# Patient Record
Sex: Male | Born: 1985 | Race: White | Hispanic: No | Marital: Single | Smoking: Current some day smoker
Health system: Southern US, Community
[De-identification: ages and names within clinical notes are randomized; demographics above are authoritative.]

## PROBLEM LIST (undated history)

## (undated) DIAGNOSIS — B1920 Unspecified viral hepatitis C without hepatic coma: Secondary | ICD-10-CM

---
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
## 2004-02-12 ENCOUNTER — Emergency Department: Payer: Self-pay | Admitting: Emergency Medicine

## 2007-03-26 ENCOUNTER — Emergency Department: Payer: Self-pay | Admitting: Emergency Medicine

## 2007-10-30 ENCOUNTER — Emergency Department: Payer: Self-pay | Admitting: Emergency Medicine

## 2007-12-04 ENCOUNTER — Emergency Department: Payer: Self-pay | Admitting: Emergency Medicine

## 2009-09-22 ENCOUNTER — Ambulatory Visit: Payer: Self-pay | Admitting: Podiatry

## 2013-03-31 ENCOUNTER — Emergency Department: Payer: Self-pay | Admitting: Emergency Medicine

## 2013-03-31 LAB — CBC
HCT: 53.5 % — AB (ref 40.0–52.0)
HGB: 18.4 g/dL — ABNORMAL HIGH (ref 13.0–18.0)
MCH: 32.9 pg (ref 26.0–34.0)
MCHC: 34.4 g/dL (ref 32.0–36.0)
MCV: 96 fL (ref 80–100)
Platelet: 161 10*3/uL (ref 150–440)
RBC: 5.59 10*6/uL (ref 4.40–5.90)
RDW: 14.7 % — ABNORMAL HIGH (ref 11.5–14.5)
WBC: 7.4 10*3/uL (ref 3.8–10.6)

## 2013-03-31 LAB — TROPONIN I: Troponin-I: <0.02 ng/mL

## 2013-03-31 LAB — BASIC METABOLIC PANEL
Anion Gap: 7 (ref 7–16)
BUN: 4 mg/dL — AB (ref 7–18)
CALCIUM: 9.2 mg/dL (ref 8.5–10.1)
CO2: 28 mmol/L (ref 21–32)
Chloride: 100 mmol/L (ref 98–107)
Creatinine: 0.75 mg/dL (ref 0.60–1.30)
EGFR (Non-African Amer.): >60
GLUCOSE: 133 mg/dL — AB (ref 65–99)
Osmolality: 269 (ref 275–301)
Potassium: 4.4 mmol/L (ref 3.5–5.1)
Sodium: 135 mmol/L — ABNORMAL LOW (ref 136–145)

## 2013-06-01 ENCOUNTER — Emergency Department: Payer: Self-pay | Admitting: Emergency Medicine

## 2013-06-01 LAB — BASIC METABOLIC PANEL
Anion Gap: 8 (ref 7–16)
BUN: 5 mg/dL — AB (ref 7–18)
CALCIUM: 8.6 mg/dL (ref 8.5–10.1)
CHLORIDE: 99 mmol/L (ref 98–107)
CO2: 28 mmol/L (ref 21–32)
Creatinine: 0.67 mg/dL (ref 0.60–1.30)
EGFR (Non-African Amer.): >60
GLUCOSE: 90 mg/dL (ref 65–99)
Osmolality: 267 (ref 275–301)
POTASSIUM: 4 mmol/L (ref 3.5–5.1)
Sodium: 135 mmol/L — ABNORMAL LOW (ref 136–145)

## 2013-06-01 LAB — CBC
HCT: 43.7 % (ref 40.0–52.0)
HGB: 15.1 g/dL (ref 13.0–18.0)
MCH: 32 pg (ref 26.0–34.0)
MCHC: 34.6 g/dL (ref 32.0–36.0)
MCV: 93 fL (ref 80–100)
Platelet: 155 10*3/uL (ref 150–440)
RBC: 4.72 10*6/uL (ref 4.40–5.90)
RDW: 14 % (ref 11.5–14.5)
WBC: 3.8 10*3/uL (ref 3.8–10.6)

## 2013-06-01 LAB — TROPONIN I: Troponin-I: <0.02 ng/mL

## 2013-06-03 ENCOUNTER — Emergency Department: Payer: Self-pay | Admitting: Emergency Medicine

## 2013-06-03 LAB — CBC
HCT: 41.3 % (ref 40.0–52.0)
HGB: 14.2 g/dL (ref 13.0–18.0)
MCH: 32.1 pg (ref 26.0–34.0)
MCHC: 34.3 g/dL (ref 32.0–36.0)
MCV: 94 fL (ref 80–100)
Platelet: 125 10*3/uL — ABNORMAL LOW (ref 150–440)
RBC: 4.42 10*6/uL (ref 4.40–5.90)
RDW: 14.2 % (ref 11.5–14.5)
WBC: 3.4 10*3/uL — ABNORMAL LOW (ref 3.8–10.6)

## 2013-06-03 LAB — BASIC METABOLIC PANEL
Anion Gap: 6 — ABNORMAL LOW (ref 7–16)
BUN: 4 mg/dL — ABNORMAL LOW (ref 7–18)
CREATININE: 0.63 mg/dL (ref 0.60–1.30)
Calcium, Total: 8.7 mg/dL (ref 8.5–10.1)
Chloride: 100 mmol/L (ref 98–107)
Co2: 30 mmol/L (ref 21–32)
GLUCOSE: 74 mg/dL (ref 65–99)
Osmolality: 267 (ref 275–301)
Potassium: 3.6 mmol/L (ref 3.5–5.1)
SODIUM: 136 mmol/L (ref 136–145)

## 2013-06-03 LAB — TROPONIN I: Troponin-I: <0.02 ng/mL

## 2021-07-19 ENCOUNTER — Encounter: Payer: Self-pay | Admitting: Emergency Medicine

## 2021-07-19 ENCOUNTER — Emergency Department: Payer: Self-pay

## 2021-07-19 ENCOUNTER — Inpatient Hospital Stay
Admission: EM | Admit: 2021-07-19 | Discharge: 2021-07-20 | DRG: 603 | Discharge status: Against Medical Advice | Payer: Self-pay | Attending: Internal Medicine | Admitting: Internal Medicine

## 2021-07-19 DIAGNOSIS — E871 Hypo-osmolality and hyponatremia: Secondary | ICD-10-CM | POA: Diagnosis present

## 2021-07-19 DIAGNOSIS — L03116 Cellulitis of left lower limb: Secondary | ICD-10-CM | POA: Diagnosis present

## 2021-07-19 DIAGNOSIS — B182 Chronic viral hepatitis C: Secondary | ICD-10-CM

## 2021-07-19 DIAGNOSIS — Z720 Tobacco use: Secondary | ICD-10-CM

## 2021-07-19 DIAGNOSIS — L03115 Cellulitis of right lower limb: Principal | ICD-10-CM | POA: Diagnosis present

## 2021-07-19 DIAGNOSIS — I4510 Unspecified right bundle-branch block: Secondary | ICD-10-CM | POA: Diagnosis present

## 2021-07-19 DIAGNOSIS — F17210 Nicotine dependence, cigarettes, uncomplicated: Secondary | ICD-10-CM | POA: Diagnosis present

## 2021-07-19 DIAGNOSIS — E878 Other disorders of electrolyte and fluid balance, not elsewhere classified: Secondary | ICD-10-CM | POA: Diagnosis present

## 2021-07-19 DIAGNOSIS — R000 Tachycardia, unspecified: Secondary | ICD-10-CM

## 2021-07-19 DIAGNOSIS — F1010 Alcohol abuse, uncomplicated: Secondary | ICD-10-CM

## 2021-07-19 DIAGNOSIS — B1920 Unspecified viral hepatitis C without hepatic coma: Secondary | ICD-10-CM

## 2021-07-19 HISTORY — DX: Unspecified viral hepatitis C without hepatic coma: B19.20

## 2021-07-19 LAB — TROPONIN I (HIGH SENSITIVITY): Troponin I (High Sensitivity): 4 ng/L (ref <18)

## 2021-07-19 LAB — BASIC METABOLIC PANEL
Anion gap: 12 (ref 5–15)
BUN: 13 mg/dL (ref 6–20)
CO2: 22 mmol/L (ref 22–32)
Calcium: 8.9 mg/dL (ref 8.9–10.3)
Chloride: 96 mmol/L — ABNORMAL LOW (ref 98–111)
Creatinine, Ser: 0.84 mg/dL (ref 0.61–1.24)
GFR, Estimated: >60 mL/min (ref >60)
Glucose, Bld: 95 mg/dL (ref 70–99)
Potassium: 3.7 mmol/L (ref 3.5–5.1)
Sodium: 130 mmol/L — ABNORMAL LOW (ref 135–145)

## 2021-07-19 LAB — CBC
HCT: 38.8 % — ABNORMAL LOW (ref 39.0–52.0)
Hemoglobin: 13.2 g/dL (ref 13.0–17.0)
MCH: 29.6 pg (ref 26.0–34.0)
MCHC: 34 g/dL (ref 30.0–36.0)
MCV: 87 fL (ref 80.0–100.0)
Platelets: 259 10*3/uL (ref 150–400)
RBC: 4.46 MIL/uL (ref 4.22–5.81)
RDW: 12.5 % (ref 11.5–15.5)
WBC: 10.4 10*3/uL (ref 4.0–10.5)
nRBC: 0 % (ref 0.0–0.2)

## 2021-07-19 LAB — BRAIN NATRIURETIC PEPTIDE: B Natriuretic Peptide: 21.9 pg/mL (ref 0.0–100.0)

## 2021-07-19 LAB — LACTIC ACID, PLASMA: Lactic Acid, Venous: 1.2 mmol/L (ref 0.5–1.9)

## 2021-07-19 MED ORDER — MORPHINE SULFATE (PF) 4 MG/ML IV SOLN
4.0000 mg | Freq: Once | INTRAVENOUS | Status: AC
  Administered 2021-07-19: 4 mg via INTRAVENOUS
  Filled 2021-07-19: qty 1

## 2021-07-19 MED ORDER — VANCOMYCIN HCL 1500 MG/300ML IV SOLN
1500.0000 mg | Freq: Once | INTRAVENOUS | Status: AC
Start: 2021-07-19 — End: 2021-07-20
  Administered 2021-07-20: 1500 mg via INTRAVENOUS
  Filled 2021-07-19: qty 300

## 2021-07-19 MED ORDER — LACTATED RINGERS IV BOLUS
2000.0000 mL | Freq: Once | INTRAVENOUS | Status: AC
Start: 2021-07-19 — End: 2021-07-20
  Administered 2021-07-19: 2000 mL via INTRAVENOUS

## 2021-07-19 MED ORDER — SODIUM CHLORIDE 0.9 % IV SOLN
2.0000 g | Freq: Once | INTRAVENOUS | Status: AC
  Administered 2021-07-19: 2 g via INTRAVENOUS
  Filled 2021-07-19: qty 20

## 2021-07-19 MED ORDER — ONDANSETRON HCL 4 MG/2ML IJ SOLN
4.0000 mg | Freq: Once | INTRAMUSCULAR | Status: AC
  Administered 2021-07-19: 4 mg via INTRAVENOUS
  Filled 2021-07-19: qty 2

## 2021-07-19 NOTE — Progress Notes (Signed)
CODE SEPSIS - PHARMACY COMMUNICATION ? ?**Broad Spectrum Antibiotics should be administered within 1 hour of Sepsis diagnosis** ? ?Time Code Sepsis Called/Page Received: 2250 ? ?Antibiotics Ordered: Ceftriaxone & Vancomycin ? ?Time of 1st antibiotic administration: 2250 ? ?Otelia Sergeant, PharmD, MBA ?07/19/2021 ?11:01 PM ? ?

## 2021-07-19 NOTE — Consult Note (Signed)
PHARMACY -  BRIEF ANTIBIOTIC NOTE  ? ?Pharmacy has received consult(s) for Vancomycin from an ED provider.  The patient's profile has been reviewed for ht/wt/allergies/indication/available labs.   ? ?One time order(s) placed for Vancomycin 1500mg  IV x 1 dose. ? ?Further antibiotics/pharmacy consults should be ordered by admitting physician if indicated.       ?                ?Thank you, ?Samarrah Tranchina A Hercules Hasler ?07/19/2021  10:18 PM ? ?

## 2021-07-19 NOTE — ED Provider Notes (Signed)
? ?[facility] ?Provider Note ? ? ? Event Date/Time  ? First MD Initiated Contact with Patient 07/19/21 2153   ?  (approximate) ? ? ?History  ? ?Leg Swelling ? ? ?HPI ? ?Douglas Collins is a 36 y.o. male here with bilateral foot pain and swelling.  The patient states that over the last 2 days or so, he has noticed progressively worsening redness, pain, swelling, in his bilateral feet.  He states that he recently got out of prison about 2 weeks ago, and states he has been on his feet essentially nonstop since then.  He denies direct trauma.  He states that his pain has gotten significant in the right greater than left ankles, as well as redness and fever.  He said chills.  He has had some mild nausea.  He has no known history of recurrent cellulitis.  Denies any direct injuries.  No numbness or weakness.  No history of DVT or PE.  Denies drug use. ?  ? ? ?Physical Exam  ? ?Triage Vital Signs: ?ED Triage Vitals  ?Enc Vitals Group  ?   BP 07/19/21 2104 111/63  ?   Pulse Rate 07/19/21 2104 (!) 110  ?   Resp 07/19/21 2104 18  ?   Temp 07/19/21 2104 100 ?F (37.8 ?C)  ?   Temp Source 07/19/21 2104 Oral  ?   SpO2 07/19/21 2104 96 %  ?   Weight 07/19/21 2105 161 lb (73 kg)  ?   Height 07/19/21 2105 5\' 8"  (1.727 m)  ?   Head Circumference --   ?   Peak Flow --   ?   Pain Score 07/19/21 2105 4  ?   Pain Loc --   ?   Pain Edu? --   ?   Excl. in GC? --   ? ? ?Most recent vital signs: ?Vitals:  ? 07/19/21 2104  ?BP: 111/63  ?Pulse: (!) 110  ?Resp: 18  ?Temp: 100 ?F (37.8 ?C)  ?SpO2: 96%  ? ? ? ?General: Awake, no distress.  ?CV:  Good peripheral perfusion.  Tachycardic. ?Resp:  Normal effort.  Lungs clear. ?Abd:  No distention.  No tenderness. ?Other:  Marked erythema, induration, and edema of the bilateral ankles, with erythema on the right leg streaking up to the knee medially.  There is erythema streaking on the left to about the mid calf.  Diffuse tenderness to palpation.  No overt  fluctuance. ? ? ?ED Results / Procedures / Treatments  ? ?Labs ?(all labs ordered are listed, but only abnormal results are displayed) ?Labs Reviewed  ?BASIC METABOLIC PANEL - Abnormal; Notable for the following components:  ?    Result Value  ? Sodium 130 (*)   ? Chloride 96 (*)   ? All other components within normal limits  ?CBC - Abnormal; Notable for the following components:  ? HCT 38.8 (*)   ? All other components within normal limits  ?CULTURE, BLOOD (ROUTINE X 2)  ?CULTURE, BLOOD (ROUTINE X 2)  ?BRAIN NATRIURETIC PEPTIDE  ?LACTIC ACID, PLASMA  ?LACTIC ACID, PLASMA  ?TROPONIN I (HIGH SENSITIVITY)  ?TROPONIN I (HIGH SENSITIVITY)  ? ? ? ?EKG ? ? ? ?RADIOLOGY ?DVT study bilaterally: Negative for DVT ?Chest x-ray: No active disease ? ? ?I also independently reviewed and agree with radiologist interpretations. ? ? ?PROCEDURES: ? ?Critical Care performed: Yes, see critical care procedure note(s) ? ?.Critical Care ?Performed by: [id number], MD ?Authorized by: Shaune Pollack, MD  ? ?  Critical care provider statement:  ?  Critical care time (minutes):  30 ?  Critical care time was exclusive of:  Separately billable procedures and treating other patients ?  Critical care was necessary to treat or prevent imminent or life-threatening deterioration of the following conditions:  Circulatory failure, cardiac failure and respiratory failure ?  Critical care was time spent personally by me on the following activities:  Development of treatment plan with patient or surrogate, discussions with consultants, evaluation of patient's response to treatment, examination of patient, ordering and review of laboratory studies, ordering and review of radiographic studies, ordering and performing treatments and interventions, pulse oximetry, re-evaluation of patient's condition and review of old charts ? ? ? ?MEDICATIONS ORDERED IN ED: ?Medications  ?vancomycin (VANCOREADY) IVPB 1500 mg/300 mL (has no administration in time range)   ?cefTRIAXone (ROCEPHIN) 2 g in sodium chloride 0.9 % 100 mL IVPB (2 g Intravenous New Bag/Given 07/19/21 2250)  ?lactated ringers bolus 2,000 mL (2,000 mLs Intravenous New Bag/Given 07/19/21 2246)  ?morphine (PF) 4 MG/ML injection 4 mg (4 mg Intravenous Given 07/19/21 2252)  ?ondansetron [facility]) injection 4 mg (4 mg Intravenous Given 07/19/21 2250)  ? ? ? ?IMPRESSION / MDM / ASSESSMENT AND PLAN / ED COURSE  ?I reviewed the triage vital signs and the nursing notes. ?             ?               ? ? ?The patient is on the cardiac monitor to evaluate for evidence of arrhythmia and/or significant heart rate changes. ? ? ?Ddx:  ?Cellulitis, DVT, RSD, vasculitis, CHF, venous insufficiency ? ? ?MDM:  ?36 year old male here with significant bilateral lower extremity edema and erythema.  Exam is consistent with acute, significant cellulitis with streaking erythema up the right and left legs.  Suspect this is secondary to reportedly being on his feet for "days" since his recent release from prison.  Patient has a temperature of 100, is tachycardic, and borderline hypotensive.  He said chills and rigors at home.  Cellulitis extended fairly rapidly throughout the last 24 hours.  Lab work is overall reassuring with no leukocytosis.  CMP unremarkable.  Lactic acid is normal.  DVT study obtained, reviewed, and is negative.  Given the extent of the cellulitis, poor social situation with difficulty obtaining antibiotics, rapid spread, and borderline sepsis, will plan to admit for observation.  Vancomycin and Rocephin given. ? ? ?MEDICATIONS GIVEN IN ED: ?Medications  ?vancomycin (VANCOREADY) IVPB 1500 mg/300 mL (has no administration in time range)  ?cefTRIAXone (ROCEPHIN) 2 g in sodium chloride 0.9 % 100 mL IVPB (2 g Intravenous New Bag/Given 07/19/21 2250)  ?lactated ringers bolus 2,000 mL (2,000 mLs Intravenous New Bag/Given 07/19/21 2246)  ?morphine (PF) 4 MG/ML injection 4 mg (4 mg Intravenous Given 07/19/21 2252)  ?ondansetron [facility])  injection 4 mg (4 mg Intravenous Given 07/19/21 2250)  ? ? ? ?Consults:  ?Hospitalist consulted for admission ? ? ? ?FINAL CLINICAL IMPRESSION(S) / ED DIAGNOSES  ? ?Final diagnoses:  ?Bilateral cellulitis of lower leg  ?Tachycardia  ? ? ? ?Rx / DC Orders  ? ?ED Discharge Orders   ? ? None  ? ?  ? ? ? ?Note:  This document was prepared using Dragon voice recognition software and may include unintentional dictation errors. ?  ?Shaune Pollack, MD ?07/19/21 2330 ? ?

## 2021-07-19 NOTE — Sepsis Progress Note (Signed)
Following per sepsis protocol   

## 2021-07-19 NOTE — ED Triage Notes (Signed)
Pt presents via POV with complaints of bilateral leg swelling intermittently for the last 2-3 years. +1 pitting edema to bilateral ankles with mild erythema. Pt endorses mild chest discomfort & SOB intermittently.  ?

## 2021-07-20 ENCOUNTER — Other Ambulatory Visit: Payer: Self-pay

## 2021-07-20 ENCOUNTER — Encounter: Payer: Self-pay | Admitting: Family Medicine

## 2021-07-20 DIAGNOSIS — Z720 Tobacco use: Secondary | ICD-10-CM

## 2021-07-20 DIAGNOSIS — B1920 Unspecified viral hepatitis C without hepatic coma: Secondary | ICD-10-CM

## 2021-07-20 DIAGNOSIS — F1010 Alcohol abuse, uncomplicated: Secondary | ICD-10-CM

## 2021-07-20 LAB — TROPONIN I (HIGH SENSITIVITY): Troponin I (High Sensitivity): 3 ng/L (ref <18)

## 2021-07-20 LAB — CBC
HCT: 38.5 % — ABNORMAL LOW (ref 39.0–52.0)
Hemoglobin: 12.9 g/dL — ABNORMAL LOW (ref 13.0–17.0)
MCH: 29.9 pg (ref 26.0–34.0)
MCHC: 33.5 g/dL (ref 30.0–36.0)
MCV: 89.3 fL (ref 80.0–100.0)
Platelets: 248 10*3/uL (ref 150–400)
RBC: 4.31 MIL/uL (ref 4.22–5.81)
RDW: 12.4 % (ref 11.5–15.5)
WBC: 6.7 10*3/uL (ref 4.0–10.5)
nRBC: 0 % (ref 0.0–0.2)

## 2021-07-20 LAB — HIV ANTIBODY (ROUTINE TESTING W REFLEX): HIV Screen 4th Generation wRfx: NONREACTIVE

## 2021-07-20 LAB — BASIC METABOLIC PANEL
Anion gap: 7 (ref 5–15)
BUN: 13 mg/dL (ref 6–20)
CO2: 29 mmol/L (ref 22–32)
Calcium: 8.6 mg/dL — ABNORMAL LOW (ref 8.9–10.3)
Chloride: 99 mmol/L (ref 98–111)
Creatinine, Ser: 0.87 mg/dL (ref 0.61–1.24)
GFR, Estimated: >60 mL/min (ref >60)
Glucose, Bld: 80 mg/dL (ref 70–99)
Potassium: 3.5 mmol/L (ref 3.5–5.1)
Sodium: 135 mmol/L (ref 135–145)

## 2021-07-20 LAB — MRSA NEXT GEN BY PCR, NASAL: MRSA by PCR Next Gen: NOT DETECTED

## 2021-07-20 MED ORDER — ONDANSETRON HCL 4 MG/2ML IJ SOLN: 4.0000 mg | Freq: Four times a day (QID) | INTRAMUSCULAR | Status: DC | PRN

## 2021-07-20 MED ORDER — HYDROCODONE-ACETAMINOPHEN 7.5-325 MG PO TABS
1.0000 | ORAL_TABLET | Freq: Four times a day (QID) | ORAL | Status: DC | PRN
  Administered 2021-07-20: 1 via ORAL
  Filled 2021-07-20: qty 1

## 2021-07-20 MED ORDER — ENOXAPARIN SODIUM 40 MG/0.4ML IJ SOSY: 40.0000 mg | PREFILLED_SYRINGE | INTRAMUSCULAR | Status: DC

## 2021-07-20 MED ORDER — ACETAMINOPHEN 325 MG PO TABS
650.0000 mg | ORAL_TABLET | Freq: Four times a day (QID) | ORAL | Status: DC | PRN
  Administered 2021-07-20: 650 mg via ORAL
  Filled 2021-07-20: qty 2

## 2021-07-20 MED ORDER — ONDANSETRON HCL 4 MG PO TABS: 4.0000 mg | ORAL_TABLET | Freq: Four times a day (QID) | ORAL | Status: DC | PRN

## 2021-07-20 MED ORDER — MAGNESIUM HYDROXIDE 400 MG/5ML PO SUSP: 30.0000 mL | Freq: Every day | ORAL | Status: DC | PRN

## 2021-07-20 MED ORDER — LORAZEPAM 2 MG/ML IJ SOLN: 1.0000 mg | INTRAMUSCULAR | Status: DC | PRN

## 2021-07-20 MED ORDER — THIAMINE HCL 100 MG/ML IJ SOLN
Freq: Once | INTRAVENOUS | Status: AC
  Filled 2021-07-20: qty 1000

## 2021-07-20 MED ORDER — SODIUM CHLORIDE 0.9 % IV SOLN
1.0000 g | INTRAVENOUS | Status: DC
  Filled 2021-07-20: qty 10

## 2021-07-20 MED ORDER — TRAZODONE HCL 50 MG PO TABS: 25.0000 mg | ORAL_TABLET | Freq: Every evening | ORAL | Status: DC | PRN

## 2021-07-20 MED ORDER — SODIUM CHLORIDE 0.9 % IV SOLN: INTRAVENOUS | Status: DC

## 2021-07-20 MED ORDER — ACETAMINOPHEN 650 MG RE SUPP: 650.0000 mg | Freq: Four times a day (QID) | RECTAL | Status: DC | PRN

## 2021-07-20 NOTE — Code Documentation (Deleted)
Stroke Response Nurse Documentation ?Code Documentation ? ?Douglas Collins is a 36 y.o. male arriving to [facility]  via [facility] on 07/20/2021 with past medical hx of ICH, HTN, diabetes. On No antithrombotic. Code stroke was activated by EMS.  ? ?Patient from home where he was LKW at 0830 and now complaining of difficulty speaking. Patient woke up this morning and said " good morning" to his daughter. At that time he did not feel there was a change in his speech. Around 0830 his symptoms worsen. En route his symptoms were noted to improved, per EMS.  ? ?Stroke team at the bedside on patient arrival. Labs drawn and patient cleared for CT by Dr. Posey Rea. Patient to CT with team.  ? ?NIHSS 3, see documentation for details and code stroke times. Patient with right facial droop, left decreased sensation, and dysarthria  on exam.  ? ?The following imaging was completed:  CT Head, CTA, and CTP.  ? ?Patient is not a candidate for IV Thrombolytic due to history of ICH. Patient is not not a candidate for IR due to LVO negative.  ? ?Care Plan: q2h NIHSS and VS.  ? ?Bedside handoff with ED RN Douglas Collins.   ? ?Douglas Collins  ?Rapid Response RN ? ? ?

## 2021-07-20 NOTE — Assessment & Plan Note (Addendum)
-   I counseled for smoking cessation and he will receive further counseling here. ?

## 2021-07-20 NOTE — H&P (Addendum)
?  ?  ?[place] ? ? ?PATIENT NAME: Douglas Collins   ? ?MR#:  [id number] ? ?DATE OF BIRTH:  1985-07-03 ? ?DATE OF ADMISSION:  07/19/2021 ? ?PRIMARY CARE PHYSICIAN: Pcp, No  ? ?Patient is coming from: Home ? ?REQUESTING/REFERRING PHYSICIAN: Shaune Pollack, ? ?CHIEF COMPLAINT:  ? ?Chief Complaint  ?Patient presents with  ? Leg Swelling  ? ? ?HISTORY OF PRESENT ILLNESS:  ?Douglas Collins is a 36 y.o. male with medical history significant for hepatitis C, ongoing tobacco and ETOH abuse, who presented to the emergency room with acute onset of worsening right more than left leg swelling with erythema, pain and tenderness.  He denied any fever or chills.  He denied walking bare feet.  He denied any insect bites or skin scratches.  No nausea or vomiting.  No chest pain or dyspnea or cough or wheezing.  No dysuria, oliguria, hematuria, urinary frequency or urgency or flank pain.  He admits to occasional palpitations. ? ?ED Course: When he came to the ER, temperature was 100 with heart rate of 110 and with otherwise normal vital signs.  Later on BP was 152/80.  Labs revealed hyponatremia and hypochloremia.  Lactic acid was 1.2.  High-sensitivity troponin I was 4 and BNP 21.9.  Blood cultures were drawn.  Bilateral lower extremity venous Doppler to back negative for DVT. ?EKG as reviewed by me : EKG showed sinus tachycardia with rate of 108 with incomplete right bundle branch block ?Imaging: Two-view chest x-ray showed no acute cardiopulmonary disease. Bilateral lower extremity venous Doppler to back negative for DVT. ? ?The patient was given 2 L bolus of IV lactated Ringer, 2 g of IV Rocephin, 4 mg of IV morphine sulfate and 4 mg of IV Zofran as well as IV vancomycin.  He will be admitted to a medical bed for further evaluation and management ?PAST MEDICAL HISTORY:  ? ?Past Medical History:  ?Diagnosis Date  ? Hepatitis C   ?Tobacco and alcohol abuse. ? ?PAST SURGICAL HISTORY:  [place] ? ?SOCIAL HISTORY:  ? ?Social History  ? ?Tobacco Use  ? Smoking status: Some Days  ?  Types: Cigarettes  ? Smokeless tobacco: Not on file  ?Substance Use Topics  ? Alcohol use: Not Currently  ? ? ?FAMILY HISTORY:  ?Positive for cancer, diabetes mellitus and CVA. ? ?DRUG ALLERGIES:  ?Not on File ? ?REVIEW OF SYSTEMS:  ? ?ROS ?As per history of present illness. All pertinent systems were reviewed above. Constitutional, HEENT, cardiovascular, respiratory, GI, GU, musculoskeletal, neuro, psychiatric, endocrine, integumentary and hematologic systems were reviewed and are otherwise negative/unremarkable except for positive findings mentioned above in the HPI. ? ? ?MEDICATIONS AT HOME:  ? ?Prior to Admission medications   ?Not on File  ? ?  ? ?VITAL SIGNS:  ?Blood pressure (!) 125/48, pulse (!) 110, temperature 98.3 ?F (36.8 ?C), temperature source Oral, resp. rate 17, height 5\' 8"  (1.727 m), weight 73 kg, SpO2 97 %. ? ?PHYSICAL EXAMINATION:  ?Physical Exam ? ?GENERAL:  36 y.o.-year-old Caucasian male patient lying in the bed with no acute distress.  ?EYES: Pupils equal, round, reactive to light and accommodation. No scleral icterus. Extraocular muscles intact.  ?HEENT: Head atraumatic, normocephalic. Oropharynx and nasopharynx clear.  ?NECK:  Supple, no jugular venous distention. No thyroid enlargement, no tenderness.  ?LUNGS: Normal breath sounds bilaterally, no wheezing, rales,rhonchi or crepitation. No use of accessory muscles of respiration.  ?CARDIOVASCULAR: Regular rate and rhythm, S1, S2 normal. No murmurs,  rubs, or gallops.  ?ABDOMEN: Soft, nondistended, nontender. Bowel sounds present. No organomegaly or mass.  ?EXTREMITIES/skin: Bilateral feet erythema and swelling more on the right than the left extending to the right lower leg.  No cyanosis, or clubbing.  ?NEUROLOGIC: Cranial nerves II through XII are intact. Muscle strength 5/5 in all extremities. Sensation intact. Gait not checked.  ?PSYCHIATRIC: The  patient is alert and oriented x 3.  Normal affect and good eye contact. ?SKIN: As above with no other rashes, lesions, or ulcers.  ? ? ?LABORATORY PANEL:  ? ?CBC ?Recent Labs  ?Lab 07/19/21 ?2112  ?WBC 10.4  ?HGB 13.2  ?HCT 38.8*  ?PLT 259  ? ?------------------------------------------------------------------------------------------------------------------ ? ?Chemistries  ?Recent Labs  ?Lab 07/19/21 ?2112  ?NA 130*  ?K 3.7  ?CL 96*  ?CO2 22  ?GLUCOSE 95  ?BUN 13  ?CREATININE 0.84  ?CALCIUM 8.9  ? ?------------------------------------------------------------------------------------------------------------------ ? ?Cardiac Enzymes ?No results for input(s): TROPONINI in the last 168 hours. ?------------------------------------------------------------------------------------------------------------------ ? ?RADIOLOGY:  ?DG Chest 2 View ? ?Result Date: 07/19/2021 ?CLINICAL DATA:  Chest pain and shortness of breath EXAM: CHEST - 2 VIEW COMPARISON:  06/03/2013 FINDINGS: The heart size and mediastinal contours are within normal limits. Both lungs are clear. The visualized skeletal structures are unremarkable. IMPRESSION: No active cardiopulmonary disease. Electronically Signed   By: Alcide CleverMark  Lukens M.D.   On: 07/19/2021 21:47  ? ?US Venous Img Lower Bilateral ? ?Result Date: 07/19/2021 ?CLINICAL DATA:  Chest pain, intermittent bilateral leg swelling for 2-3 years EXAM: BILATERAL LOWER EXTREMITY VENOUS DOPPLER ULTRASOUND TECHNIQUE: Gray-scale sonography with compression, as well as color and duplex ultrasound, were performed to evaluate the deep venous system(s) from the level of the common femoral vein through the popliteal and proximal calf veins. COMPARISON:  None Available. FINDINGS: VENOUS Normal compressibility of the common femoral, superficial femoral, and popliteal veins, as well as the visualized calf veins. Visualized portions of profunda femoral vein and great saphenous vein unremarkable. No filling defects to suggest  DVT on grayscale or color Doppler imaging. Doppler waveforms show normal direction of venous flow, normal respiratory plasticity and response to augmentation. OTHER None. Limitations: none IMPRESSION: 1. No evidence of deep venous thrombosis within either lower extremity. Electronically Signed   By: Sharlet SalinaMichael  Brown M.D.   On: 07/19/2021 22:36   ? ? ? ?IMPRESSION AND PLAN:  ?Assessment and Plan: ?* Cellulitis of both feet ?- The patient will be admitted to a medical bed. ?- We will continue antibiotic therapy with IV Rocephin for moderate nonpurulent cellulitis. ?- Pain management will be provided. ?- Warm compresses will be applied. ? ?Hepatitis C ?- She has not been treated for it. ?- We will check LFTs and coag profile. ? ?Alcohol abuse ?- His last alcoholic drink was this morning.  He usually drinks about 4 cans of beer per day. ?- We will place him on banana bag daily and as needed IV Ativan for alcohol withdrawal. ? ?Tobacco abuse ?- I counseled for smoking cessation and he will receive further counseling here. ? ? ?DVT prophylaxis: Lovenox.  ?Advanced Care Planning:  Code Status: full code.  ?Family Communication:  The plan of care was discussed in details with the patient (and family). ?I answered all questions. The patient agreed to proceed with the above mentioned plan. Further management will depend upon hospital course. ?Disposition Plan: Back to previous home environment ?Consults called: none.  ?All the records are reviewed and case discussed with ED provider. ? ?Status is: Inpatient ? ?At the  time of the admission, it appears that the appropriate admission status for this patient is inpatient.  This is judged to be reasonable and necessary in order to provide the required intensity of service to ensure the patient's safety given the presenting symptoms, physical exam findings and initial radiographic and laboratory data in the context of comorbid conditions.  The patient requires inpatient status due to  high intensity of service, high risk of further deterioration and high frequency of surveillance required. ? ?I certify that at the time of admission, it is my clinical judgment that the patient will require

## 2021-07-20 NOTE — Progress Notes (Signed)
Pt spoke to me about the need to leave because his belongings are with a friend who is threatening to get rid of his things. Wanted to know if MD was going to d/c him today. Messaged MD and was made aware that pt would not be d/c'd today. Pt made aware and decided to leave AMA. IV removed intact. AMA paper signed. Pt walked off unit by self.  ?

## 2021-07-20 NOTE — Discharge Summary (Signed)
?Physician Discharge Summary ?  ?Patient: Douglas Collins MRN: [id number] DOB: 04/11/1985  ?Admit date:     07/19/2021  ?Discharge date: 07/20/21  ?Discharge Physician: Elianah Karis  ? ?PCP: Pcp, No  ? ?Recommendations at discharge:  ? ? The patient left [facility] ? ?Discharge Diagnoses: ?Principal Problem: ?  Cellulitis of both feet ?Active Problems: ?  Tobacco abuse ?  Alcohol abuse ?  Hepatitis C ? ?Resolved Problems: ?  * No resolved hospital problems. * ? ?Hospital Course: ?Douglas Collins is a 36 y.o. male with medical history significant for hepatitis C, ongoing tobacco and ETOH abuse, who presented to the emergency room with acute onset of worsening right more than left leg swelling with erythema, pain and tenderness.  He denied any fever or chills.  He denied walking bare feet.  He denied any insect bites or skin scratches.  No nausea or vomiting.  No chest pain or dyspnea or cough or wheezing.  No dysuria, oliguria, hematuria, urinary frequency or urgency or flank pain.  He admits to occasional palpitations. ? ?ED Course: When he came to the ER, temperature was 100 with heart rate of 110 and with otherwise normal vital signs.  Later on BP was 152/80.  Labs revealed hyponatremia and hypochloremia.  Lactic acid was 1.2.  High-sensitivity troponin I was 4 and BNP 21.9.  Blood cultures were drawn.  Bilateral lower extremity venous Doppler to back negative for DVT. ?EKG as reviewed by me : EKG showed sinus tachycardia with rate of 108 with incomplete right bundle branch block ?Imaging: Two-view chest x-ray showed no acute cardiopulmonary disease. Bilateral lower extremity venous Doppler to back negative for DVT. ? ?The patient was given 2 L bolus of IV lactated Ringer, 2 g of IV Rocephin, 4 mg of IV morphine sulfate and 4 mg of IV Zofran as well as IV vancomycin.  He will be admitted to a medical bed for further evaluation and management. Blood cultures x 2 have been obtained and have had no growth. ?  ?The  patient is having considerable pain from feet bilaterally. He was given hydrocodone for his pain. ? ?The patient left AMA. ? ?Assessment and Plan: ?* Cellulitis of both feet ?- The patient will be admitted to a medical bed. ?- We will continue antibiotic therapy with IV Rocephin for moderate nonpurulent cellulitis. ?- Pain management will be provided. ?- Warm compresses will be applied. ? ?Hepatitis C ?- She has not been treated for it. ?- We will check LFTs and coag profile. ? ?Alcohol abuse ?- His last alcoholic drink was this morning.  He usually drinks about 4 cans of beer per day. ?- We will place him on banana bag daily and as needed IV Ativan for alcohol withdrawal. ? ?Tobacco abuse ?- I counseled for smoking cessation and he will receive further counseling here. ? ? ?Consultants: None ?Procedures performed: None  ?Disposition: Left AMA ?Diet recommendation: Left AMA ? ?DISCHARGE MEDICATION: ? ?Patient left AMA. ?Discharge Exam: ?Filed Weights  ? 07/19/21 2105  ?Weight: 73 kg  ? ?Please see physical exam from progress note dated 07/20/2021. ? ?Condition at discharge:  Left AMA ? ?The results of significant diagnostics from this hospitalization (including imaging, microbiology, ancillary and laboratory) are listed below for reference.  ? ?Imaging Studies: ?DG Chest 2 View ? ?Result Date: 07/19/2021 ?CLINICAL DATA:  Chest pain and shortness of breath EXAM: CHEST - 2 VIEW COMPARISON:  06/03/2013 FINDINGS: The heart size and mediastinal contours are within normal  limits. Both lungs are clear. The visualized skeletal structures are unremarkable. IMPRESSION: No active cardiopulmonary disease. Electronically Signed   By: Alcide Clever M.D.   On: 07/19/2021 21:47  ? ?US Venous Img Lower Bilateral ? ?Result Date: 07/19/2021 ?CLINICAL DATA:  Chest pain, intermittent bilateral leg swelling for 2-3 years EXAM: BILATERAL LOWER EXTREMITY VENOUS DOPPLER ULTRASOUND TECHNIQUE: Gray-scale sonography with compression, as well as color  and duplex ultrasound, were performed to evaluate the deep venous system(s) from the level of the common femoral vein through the popliteal and proximal calf veins. COMPARISON:  None Available. FINDINGS: VENOUS Normal compressibility of the common femoral, superficial femoral, and popliteal veins, as well as the visualized calf veins. Visualized portions of profunda femoral vein and great saphenous vein unremarkable. No filling defects to suggest DVT on grayscale or color Doppler imaging. Doppler waveforms show normal direction of venous flow, normal respiratory plasticity and response to augmentation. OTHER None. Limitations: none IMPRESSION: 1. No evidence of deep venous thrombosis within either lower extremity. Electronically Signed   By: Sharlet Salina M.D.   On: 07/19/2021 22:36   ? ?Microbiology: ?Results for orders placed or performed during the hospital encounter of 07/19/21  ?Blood culture (routine x 2)     Status: None (Preliminary result)  ? Collection Time: 07/19/21 10:31 PM  ? Specimen: BLOOD  ?Result Value Ref Range Status  ? Specimen Description BLOOD LEFT HAND  Final  ? Special Requests   Final  ?  BOTTLES DRAWN AEROBIC AND ANAEROBIC Blood Culture results may not be optimal due to an excessive volume of blood received in culture bottles  ? Culture   Final  ?  NO GROWTH < 12 HOURS ?Performed at [facility], [address], [place], [place] [address] ?  ? Report Status PENDING  Incomplete  ?Blood culture (routine x 2)     Status: None (Preliminary result)  ? Collection Time: 07/19/21 10:31 PM  ? Specimen: BLOOD  ?Result Value Ref Range Status  ? Specimen Description BLOOD RIGHT ASSIST CONTROL  Final  ? Special Requests   Final  ?  BOTTLES DRAWN AEROBIC AND ANAEROBIC Blood Culture adequate volume  ? Culture   Final  ?  NO GROWTH < 12 HOURS ?Performed at [facility], [address], [place], [place] [address] ?  ? Report Status PENDING  Incomplete  ?MRSA Next Gen by PCR, Nasal      Status: None  ? Collection Time: 07/20/21  4:18 AM  ? Specimen: Nasal Mucosa; Nasal Swab  ?Result Value Ref Range Status  ? MRSA by PCR Next Gen NOT DETECTED NOT DETECTED Final  ?  Comment: (NOTE) ?The GeneXpert MRSA Assay (FDA approved for NASAL specimens only), ?is one component of a comprehensive MRSA colonization surveillance ?program. It is not intended to diagnose MRSA infection nor to guide ?or monitor treatment for MRSA infections. ?Test performance is not FDA approved in patients less than 2 years ?old. ?Performed at [facility], [address] [place], ?[place] [address] ?  ? ? ?Labs: ?CBC: ?Recent Labs  ?Lab 07/19/21 ?2112 07/20/21 ?0413  ?WBC 10.4 6.7  ?HGB 13.2 12.9*  ?HCT 38.8* 38.5*  ?MCV 87.0 89.3  ?PLT 259 248  ? ?Basic Metabolic Panel: ?Recent Labs  ?Lab 07/19/21 ?2112 07/20/21 ?0413  ?NA 130* 135  ?K 3.7 3.5  ?CL 96* 99  ?CO2 22 29  ?GLUCOSE 95 80  ?BUN 13 13  ?CREATININE 0.84 0.87  ?CALCIUM 8.9 8.6*  ? ?  Liver Function Tests: ?No results for input(s): AST, ALT, ALKPHOS, BILITOT, PROT, ALBUMIN in the last 168 hours. ?CBG: ?No results for input(s): GLUCAP in the last 168 hours. ? ?Discharge time spent: greater than 30 minutes. ? ?Signed: ?Bruce Mayers, DO ?[facility] ?07/20/2021 ?

## 2021-07-20 NOTE — Progress Notes (Signed)
?PROGRESS NOTE ? ?Douglas Collins JXB:[id number] DOB: May 03, 1985 DOA: 07/19/2021 ?PCP: Pcp, No ? ?Brief History   ?Douglas Collins is a 36 y.o. male with medical history significant for hepatitis C, ongoing tobacco and ETOH abuse, who presented to the emergency room with acute onset of worsening right more than left leg swelling with erythema, pain and tenderness.  He denied any fever or chills.  He denied walking bare feet.  He denied any insect bites or skin scratches.  No nausea or vomiting.  No chest pain or dyspnea or cough or wheezing.  No dysuria, oliguria, hematuria, urinary frequency or urgency or flank pain.  He admits to occasional palpitations. ? ?ED Course: When he came to the ER, temperature was 100 with heart rate of 110 and with otherwise normal vital signs.  Later on BP was 152/80.  Labs revealed hyponatremia and hypochloremia.  Lactic acid was 1.2.  High-sensitivity troponin I was 4 and BNP 21.9.  Blood cultures were drawn.  Bilateral lower extremity venous Doppler to back negative for DVT. ?EKG as reviewed by me : EKG showed sinus tachycardia with rate of 108 with incomplete right bundle branch block ?Imaging: Two-view chest x-ray showed no acute cardiopulmonary disease. Bilateral lower extremity venous Doppler to back negative for DVT. ? ?The patient was given 2 L bolus of IV lactated Ringer, 2 g of IV Rocephin, 4 mg of IV morphine sulfate and 4 mg of IV Zofran as well as IV vancomycin.  He will be admitted to a medical bed for further evaluation and management. Blood cultures x 2 have been obtained and have had no growth. ? ?The patient is having considerable pain from feet bilaterally. ? ?Consultants  ? ? ?Procedures  ?None ? ?Antibiotics  ? ?Anti-infectives (From admission, onward)  ? ? Start     Dose/Rate Route Frequency Ordered Stop  ? 07/20/21 2200  cefTRIAXone (ROCEPHIN) 1 g in sodium chloride 0.9 % 100 mL IVPB       ? 1 g ?200 mL/hr over 30 Minutes Intravenous Every 24 hours  07/20/21 0037 07/26/21 2159  ? 07/19/21 2230  vancomycin (VANCOREADY) IVPB 1500 mg/300 mL       ? 1,500 mg ?150 mL/hr over 120 Minutes Intravenous  Once 07/19/21 2218 07/20/21 0232  ? 07/19/21 2215  cefTRIAXone (ROCEPHIN) 2 g in sodium chloride 0.9 % 100 mL IVPB       ? 2 g ?200 mL/hr over 30 Minutes Intravenous  Once 07/19/21 2213 07/19/21 2355  ? ?  ? ?Interval History/Subjective  ?The patient is resting in bed. He is complaining bitterly of pain in his feet bilaterally. He is complaining of inadequate pain control. ? ?Objective  ? ?Vitals:  ?Vitals:  ? 07/20/21 0312 07/20/21 1308  ?BP: 126/69 133/71  ?Pulse: 95 79  ?Resp: 16 18  ?Temp: 99.1 ?F (37.3 ?C) 98.2 ?F (36.8 ?C)  ?SpO2: 97% 100%  ? ? ?Exam: ? ?Constitutional:  ?The patient is awake, alert, and oriented x 3. No acute distress. ?Respiratory:  ?No increased work of breathing. ?No wheezes, rales, or rhonchi ?No tactile fremitus ?Cardiovascular:  ?Regular rate and rhythm ?No murmurs, ectopy, or gallups. ?No lateral PMI. No thrills. ?Abdomen:  ?Abdomen is soft, non-tender, non-distended ?No hernias, masses, or organomegaly ?Normoactive bowel sounds.  ?Musculoskeletal:  ?Bilateral feet are erythematous, warm, and swollen. Very painful to touch. ?Skin:  ?No rashes, lesions, ulcers ?Erythema and swelling to feet bilaterally ?Onychomycosis to toes on feet bilaterally. ?palpation of skin: no  induration or nodules ?Neurologic:  ?CN 2-12 intact ?Sensation all 4 extremities intact ?Psychiatric:  ?Mental status ?Mood, affect appropriate ?Orientation to person, place, time  ?judgment and insight appear intact ? ? ?I have personally reviewed the following:  ? ?Today's Data  ?Vitals ? ?Lab Data  ?CBC, BMP ? ?Micro Data  ?Blood culture x 2: No growth ? ?Imaging  ?CXR ? ?Cardiology Data  ?EKG ? ?Other Data  ? ? ?Scheduled Meds: ? enoxaparin (LOVENOX) injection  40 mg Subcutaneous Q24H  ? ?Continuous Infusions: ? sodium chloride 100 mL/hr at 07/20/21 0100  ? cefTRIAXone  (ROCEPHIN)  IV    ? ? ?Principal Problem: ?  Cellulitis of both feet ?Active Problems: ?  Tobacco abuse ?  Alcohol abuse ?  Hepatitis C ? ? ?A & P  ?Assessment and Plan: ?* Cellulitis of both feet ?- The patient will be admitted to a medical bed. ?- We will continue antibiotic therapy with IV Rocephin for moderate nonpurulent cellulitis. ?- Pain management will be provided. ?- Warm compresses will be applied. ?  ?Hepatitis C ?- She has not been treated for it. ?- We will check LFTs and coag profile. ?  ?Alcohol abuse ?- His last alcoholic drink was this morning.  He usually drinks about 4 cans of beer per day. ?- We will place him on banana bag daily and as needed IV Ativan for alcohol withdrawal. ?  ?Tobacco abuse ?- I counseled for smoking cessation and he will receive further counseling here. ? ?I have seen and examined this patient myself. I have spent 34 minutes in his evaluation and care.  ?  ?DVT prophylaxis: Lovenox.  ?Advanced Care Planning:  Code Status: full code.  ?Family Communication:  The plan of care was discussed in details with the patient (and family). ?I answered all questions. The patient agreed to proceed with the above mentioned plan. Further management will depend upon hospital course. ?Disposition Plan: Back to previous home environment ? ?Raimi Guillermo, DO ?[facility] ?Direct contact: see [email or web address]  ?7PM-7AM contact night coverage as above ?07/20/2021, 1:44 PM  LOS: 1 day  ? LOS: 1 day  ? ? ? ? ? ? ?  ?

## 2021-07-20 NOTE — Assessment & Plan Note (Signed)
-   His last alcoholic drink was this morning.  He usually drinks about 4 cans of beer per day. ?- We will place him on banana bag daily and as needed IV Ativan for alcohol withdrawal. ?

## 2021-07-20 NOTE — Assessment & Plan Note (Signed)
-   She has not been treated for it. ?- We will check LFTs and coag profile. ?

## 2021-07-20 NOTE — Assessment & Plan Note (Signed)
-   The patient will be admitted to a medical bed. ?- We will continue antibiotic therapy with IV Rocephin for moderate nonpurulent cellulitis. ?- Pain management will be provided. ?- Warm compresses will be applied. ?

## 2021-07-20 NOTE — Plan of Care (Signed)

## 2021-07-24 LAB — CULTURE, BLOOD (ROUTINE X 2)
Culture: NO GROWTH
Culture: NO GROWTH
Special Requests: ADEQUATE

## 2021-08-10 ENCOUNTER — Emergency Department: Payer: Self-pay

## 2021-08-10 ENCOUNTER — Other Ambulatory Visit: Payer: Self-pay

## 2021-08-10 ENCOUNTER — Encounter: Payer: Self-pay | Admitting: Emergency Medicine

## 2021-08-10 ENCOUNTER — Inpatient Hospital Stay
Admission: EM | Admit: 2021-08-10 | Discharge: 2021-08-29 | DRG: 540 | Disposition: A | Discharge status: Home / Self Care | Payer: Self-pay | Attending: Internal Medicine | Admitting: Internal Medicine

## 2021-08-10 DIAGNOSIS — F17200 Nicotine dependence, unspecified, uncomplicated: Secondary | ICD-10-CM

## 2021-08-10 DIAGNOSIS — B9561 Methicillin susceptible Staphylococcus aureus infection as the cause of diseases classified elsewhere: Secondary | ICD-10-CM | POA: Diagnosis present

## 2021-08-10 DIAGNOSIS — F1010 Alcohol abuse, uncomplicated: Secondary | ICD-10-CM | POA: Diagnosis present

## 2021-08-10 DIAGNOSIS — L02511 Cutaneous abscess of right hand: Secondary | ICD-10-CM | POA: Diagnosis present

## 2021-08-10 DIAGNOSIS — F1911 Other psychoactive substance abuse, in remission: Secondary | ICD-10-CM

## 2021-08-10 DIAGNOSIS — B1920 Unspecified viral hepatitis C without hepatic coma: Secondary | ICD-10-CM | POA: Diagnosis present

## 2021-08-10 DIAGNOSIS — M86041 Acute hematogenous osteomyelitis, right hand: Principal | ICD-10-CM | POA: Diagnosis present

## 2021-08-10 DIAGNOSIS — F1410 Cocaine abuse, uncomplicated: Secondary | ICD-10-CM | POA: Diagnosis present

## 2021-08-10 DIAGNOSIS — F1090 Alcohol use, unspecified, uncomplicated: Secondary | ICD-10-CM

## 2021-08-10 DIAGNOSIS — E876 Hypokalemia: Secondary | ICD-10-CM | POA: Diagnosis present

## 2021-08-10 DIAGNOSIS — F17210 Nicotine dependence, cigarettes, uncomplicated: Secondary | ICD-10-CM | POA: Diagnosis present

## 2021-08-10 NOTE — ED Triage Notes (Signed)
Patient ambulatory to triage with steady gait, without difficulty or distress noted; pt reports swelling/hand to rt hand x 2 days with no known injury

## 2021-08-11 ENCOUNTER — Encounter
Admission: EM | Disposition: A | Discharge status: Home / Self Care | Payer: Self-pay | Source: Home / Self Care | Attending: Internal Medicine

## 2021-08-11 ENCOUNTER — Inpatient Hospital Stay: Payer: Self-pay

## 2021-08-11 ENCOUNTER — Encounter: Payer: Self-pay | Admitting: Certified Registered Nurse Anesthetist

## 2021-08-11 ENCOUNTER — Emergency Department: Payer: Self-pay

## 2021-08-11 DIAGNOSIS — F1090 Alcohol use, unspecified, uncomplicated: Secondary | ICD-10-CM

## 2021-08-11 DIAGNOSIS — L02511 Cutaneous abscess of right hand: Secondary | ICD-10-CM

## 2021-08-11 DIAGNOSIS — F17200 Nicotine dependence, unspecified, uncomplicated: Secondary | ICD-10-CM

## 2021-08-11 DIAGNOSIS — F1911 Other psychoactive substance abuse, in remission: Secondary | ICD-10-CM

## 2021-08-11 LAB — URINE DRUG SCREEN, QUALITATIVE (ARMC ONLY)
Amphetamines, Ur Screen: NOT DETECTED
Barbiturates, Ur Screen: NOT DETECTED
Benzodiazepine, Ur Scrn: NOT DETECTED
Cannabinoid 50 Ng, Ur ~~LOC~~: NOT DETECTED
Cocaine Metabolite,Ur ~~LOC~~: POSITIVE — AB
MDMA (Ecstasy)Ur Screen: NOT DETECTED
Methadone Scn, Ur: NOT DETECTED
Opiate, Ur Screen: POSITIVE — AB
Phencyclidine (PCP) Ur S: NOT DETECTED
Tricyclic, Ur Screen: NOT DETECTED

## 2021-08-11 LAB — URINALYSIS, COMPLETE (UACMP) WITH MICROSCOPIC
Bacteria, UA: NONE SEEN
Bilirubin Urine: NEGATIVE
Glucose, UA: NEGATIVE mg/dL
Hgb urine dipstick: NEGATIVE
Ketones, ur: NEGATIVE mg/dL
Leukocytes,Ua: NEGATIVE
Nitrite: NEGATIVE
Protein, ur: NEGATIVE mg/dL
Specific Gravity, Urine: 1.002 — ABNORMAL LOW (ref 1.005–1.030)
Squamous Epithelial / HPF: NONE SEEN (ref 0–5)
pH: 6 (ref 5.0–8.0)

## 2021-08-11 LAB — CBC WITH DIFFERENTIAL/PLATELET
Abs Immature Granulocytes: 0.01 10*3/uL (ref 0.00–0.07)
Basophils Absolute: 0 10*3/uL (ref 0.0–0.1)
Basophils Relative: 0 %
Eosinophils Absolute: 0.1 10*3/uL (ref 0.0–0.5)
Eosinophils Relative: 2 %
HCT: 45.3 % (ref 39.0–52.0)
Hemoglobin: 15.4 g/dL (ref 13.0–17.0)
Immature Granulocytes: 0 %
Lymphocytes Relative: 36 %
Lymphs Abs: 2 10*3/uL (ref 0.7–4.0)
MCH: 30.3 pg (ref 26.0–34.0)
MCHC: 34 g/dL (ref 30.0–36.0)
MCV: 89.2 fL (ref 80.0–100.0)
Monocytes Absolute: 0.8 10*3/uL (ref 0.1–1.0)
Monocytes Relative: 15 %
Neutro Abs: 2.6 10*3/uL (ref 1.7–7.7)
Neutrophils Relative %: 47 %
Platelets: 255 10*3/uL (ref 150–400)
RBC: 5.08 MIL/uL (ref 4.22–5.81)
RDW: 14.6 % (ref 11.5–15.5)
WBC: 5.5 10*3/uL (ref 4.0–10.5)
nRBC: 0 % (ref 0.0–0.2)

## 2021-08-11 LAB — LACTIC ACID, PLASMA
Lactic Acid, Venous: 1.1 mmol/L (ref 0.5–1.9)
Lactic Acid, Venous: 1.8 mmol/L (ref 0.5–1.9)

## 2021-08-11 LAB — COMPREHENSIVE METABOLIC PANEL
ALT: 72 U/L — ABNORMAL HIGH (ref 0–44)
AST: 140 U/L — ABNORMAL HIGH (ref 15–41)
Albumin: 4 g/dL (ref 3.5–5.0)
Alkaline Phosphatase: 60 U/L (ref 38–126)
Anion gap: 9 (ref 5–15)
BUN: 6 mg/dL (ref 6–20)
CO2: 27 mmol/L (ref 22–32)
Calcium: 9 mg/dL (ref 8.9–10.3)
Chloride: 99 mmol/L (ref 98–111)
Creatinine, Ser: 0.54 mg/dL — ABNORMAL LOW (ref 0.61–1.24)
GFR, Estimated: >60 mL/min (ref >60)
Glucose, Bld: 96 mg/dL (ref 70–99)
Potassium: 3.9 mmol/L (ref 3.5–5.1)
Sodium: 135 mmol/L (ref 135–145)
Total Bilirubin: 0.8 mg/dL (ref 0.3–1.2)
Total Protein: 8.1 g/dL (ref 6.5–8.1)

## 2021-08-11 LAB — PROTIME-INR
INR: 1 (ref 0.8–1.2)
Prothrombin Time: 13 seconds (ref 11.4–15.2)

## 2021-08-11 LAB — PROCALCITONIN: Procalcitonin: 0.53 ng/mL

## 2021-08-11 LAB — APTT: aPTT: 34 seconds (ref 24–36)

## 2021-08-11 SURGERY — INCISION AND DRAINAGE
Anesthesia: Choice | Site: Hand | Laterality: Right

## 2021-08-11 MED ORDER — LORAZEPAM 2 MG/ML IJ SOLN: 0.0000 mg | Freq: Four times a day (QID) | INTRAMUSCULAR | Status: AC

## 2021-08-11 MED ORDER — THIAMINE HCL 100 MG PO TABS
100.0000 mg | ORAL_TABLET | Freq: Every day | ORAL | Status: DC
  Administered 2021-08-11 – 2021-08-29 (×16): 100 mg via ORAL
  Filled 2021-08-11 (×18): qty 1

## 2021-08-11 MED ORDER — GADOBUTROL 1 MMOL/ML IV SOLN
7.0000 mL | Freq: Once | INTRAVENOUS | Status: AC | PRN
  Administered 2021-08-11: 7 mL via INTRAVENOUS

## 2021-08-11 MED ORDER — SODIUM CHLORIDE 0.9 % IV SOLN
1.0000 g | INTRAVENOUS | Status: DC
  Administered 2021-08-12 – 2021-08-16 (×5): 1 g via INTRAVENOUS
  Filled 2021-08-11 (×6): qty 10

## 2021-08-11 MED ORDER — ADULT MULTIVITAMIN W/MINERALS CH
1.0000 | ORAL_TABLET | Freq: Every day | ORAL | Status: DC
  Administered 2021-08-11 – 2021-08-29 (×16): 1 via ORAL
  Filled 2021-08-11 (×17): qty 1

## 2021-08-11 MED ORDER — LORAZEPAM 1 MG PO TABS: 1.0000 mg | ORAL_TABLET | ORAL | Status: AC | PRN

## 2021-08-11 MED ORDER — PROPOFOL 10 MG/ML IV BOLUS
INTRAVENOUS | Status: AC
  Filled 2021-08-11: qty 20

## 2021-08-11 MED ORDER — ACETAMINOPHEN 650 MG RE SUPP: 650.0000 mg | Freq: Four times a day (QID) | RECTAL | Status: DC | PRN

## 2021-08-11 MED ORDER — ONDANSETRON HCL 4 MG/2ML IJ SOLN: 4.0000 mg | Freq: Four times a day (QID) | INTRAMUSCULAR | Status: DC | PRN

## 2021-08-11 MED ORDER — KETOROLAC TROMETHAMINE 30 MG/ML IJ SOLN
15.0000 mg | Freq: Once | INTRAMUSCULAR | Status: AC
  Administered 2021-08-11: 15 mg via INTRAVENOUS
  Filled 2021-08-11: qty 1

## 2021-08-11 MED ORDER — HYDROCODONE-ACETAMINOPHEN 5-325 MG PO TABS
1.0000 | ORAL_TABLET | ORAL | Status: DC | PRN
  Administered 2021-08-11 – 2021-08-16 (×14): 2 via ORAL
  Administered 2021-08-17 (×2): 1 via ORAL
  Administered 2021-08-18 (×2): 2 via ORAL
  Administered 2021-08-18: 1 via ORAL
  Administered 2021-08-19 – 2021-08-20 (×6): 2 via ORAL
  Administered 2021-08-21: 1 via ORAL
  Administered 2021-08-21 – 2021-08-26 (×15): 2 via ORAL
  Filled 2021-08-11 (×6): qty 2
  Filled 2021-08-11: qty 1
  Filled 2021-08-11 (×8): qty 2
  Filled 2021-08-11: qty 1
  Filled 2021-08-11 (×2): qty 2
  Filled 2021-08-11: qty 1
  Filled 2021-08-11 (×4): qty 2
  Filled 2021-08-11: qty 1
  Filled 2021-08-11 (×19): qty 2

## 2021-08-11 MED ORDER — MORPHINE SULFATE (PF) 2 MG/ML IV SOLN
2.0000 mg | INTRAVENOUS | Status: DC | PRN
  Administered 2021-08-11 – 2021-08-18 (×20): 2 mg via INTRAVENOUS
  Filled 2021-08-11 (×21): qty 1

## 2021-08-11 MED ORDER — THIAMINE HCL 100 MG/ML IJ SOLN
100.0000 mg | Freq: Every day | INTRAMUSCULAR | Status: DC
  Filled 2021-08-11: qty 2

## 2021-08-11 MED ORDER — MIDAZOLAM HCL 2 MG/2ML IJ SOLN
INTRAMUSCULAR | Status: AC
  Filled 2021-08-11: qty 2

## 2021-08-11 MED ORDER — VANCOMYCIN HCL 1500 MG/300ML IV SOLN
1500.0000 mg | Freq: Two times a day (BID) | INTRAVENOUS | Status: DC
  Administered 2021-08-11 – 2021-08-16 (×11): 1500 mg via INTRAVENOUS
  Filled 2021-08-11 (×13): qty 300

## 2021-08-11 MED ORDER — SODIUM CHLORIDE 0.9 % IV SOLN
2.0000 g | Freq: Once | INTRAVENOUS | Status: AC
  Administered 2021-08-11: 2 g via INTRAVENOUS
  Filled 2021-08-11: qty 20

## 2021-08-11 MED ORDER — EPHEDRINE 5 MG/ML INJ
INTRAVENOUS | Status: AC
  Filled 2021-08-11: qty 5

## 2021-08-11 MED ORDER — LORAZEPAM 2 MG/ML IJ SOLN: 1.0000 mg | INTRAMUSCULAR | Status: AC | PRN

## 2021-08-11 MED ORDER — VANCOMYCIN HCL 1750 MG/350ML IV SOLN
1750.0000 mg | Freq: Once | INTRAVENOUS | Status: AC
  Administered 2021-08-11: 1750 mg via INTRAVENOUS
  Filled 2021-08-11: qty 350

## 2021-08-11 MED ORDER — LORAZEPAM 2 MG/ML IJ SOLN
0.0000 mg | Freq: Two times a day (BID) | INTRAMUSCULAR | Status: AC
  Administered 2021-08-14: 2 mg via INTRAVENOUS
  Filled 2021-08-11: qty 1

## 2021-08-11 MED ORDER — FOLIC ACID 1 MG PO TABS
1.0000 mg | ORAL_TABLET | Freq: Every day | ORAL | Status: DC
  Administered 2021-08-11 – 2021-08-29 (×16): 1 mg via ORAL
  Filled 2021-08-11 (×18): qty 1

## 2021-08-11 MED ORDER — ONDANSETRON HCL 4 MG PO TABS: 4.0000 mg | ORAL_TABLET | Freq: Four times a day (QID) | ORAL | Status: DC | PRN

## 2021-08-11 MED ORDER — CHLORDIAZEPOXIDE HCL 25 MG PO CAPS
25.0000 mg | ORAL_CAPSULE | Freq: Once | ORAL | Status: AC
  Administered 2021-08-11: 25 mg via ORAL
  Filled 2021-08-11: qty 1

## 2021-08-11 MED ORDER — SODIUM CHLORIDE 0.9 % IV SOLN: INTRAVENOUS | Status: AC

## 2021-08-11 MED ORDER — ACETAMINOPHEN 325 MG PO TABS: 650.0000 mg | ORAL_TABLET | Freq: Four times a day (QID) | ORAL | Status: DC | PRN

## 2021-08-11 MED ORDER — MORPHINE SULFATE (PF) 4 MG/ML IV SOLN
4.0000 mg | Freq: Once | INTRAVENOUS | Status: AC
  Administered 2021-08-11: 4 mg via INTRAVENOUS
  Filled 2021-08-11: qty 1

## 2021-08-11 SURGICAL SUPPLY — 27 items
BNDG COHESIVE 4X5 TAN ST LF (GAUZE/BANDAGES/DRESSINGS) ×2 IMPLANT
BNDG ELASTIC 4X5.8 VLCR NS LF (GAUZE/BANDAGES/DRESSINGS) ×2 IMPLANT
BNDG GAUZE ELAST 4 BULKY (GAUZE/BANDAGES/DRESSINGS) ×2 IMPLANT
CHLORAPREP W/TINT 26 (MISCELLANEOUS) ×4 IMPLANT
DRAPE 3/4 80X56 (DRAPES) IMPLANT
DRAPE INCISE IOBAN 66X60 STRL (DRAPES) ×2 IMPLANT
DRAPE SURG 17X11 SM STRL (DRAPES) IMPLANT
DRAPE U-SHAPE 47X51 STRL (DRAPES) IMPLANT
ELECT REM PT RETURN 9FT ADLT (ELECTROSURGICAL) ×2
ELECTRODE REM PT RTRN 9FT ADLT (ELECTROSURGICAL) ×1 IMPLANT
GLOVE BIO SURGEON STRL SZ7.5 (GLOVE) ×2 IMPLANT
GLOVE SURG UNDER POLY LF SZ7.5 (GLOVE) ×2 IMPLANT
GOWN STRL REUS W/ TWL XL LVL3 (GOWN DISPOSABLE) ×2 IMPLANT
GOWN STRL REUS W/TWL XL LVL3 (GOWN DISPOSABLE) ×2
IV NS IRRIG 3000ML ARTHROMATIC (IV SOLUTION) ×2 IMPLANT
KIT PREVENA INCISION MGT 13 (CANNISTER) IMPLANT
KIT TURNOVER KIT A (KITS) ×2 IMPLANT
MANIFOLD NEPTUNE II (INSTRUMENTS) ×2 IMPLANT
NS IRRIG 1000ML POUR BTL (IV SOLUTION) ×2 IMPLANT
PACK EXTREMITY ARMC (MISCELLANEOUS) ×2 IMPLANT
PAD CAST CTTN 4X4 STRL (SOFTGOODS) ×1 IMPLANT
PADDING CAST COTTON 4X4 STRL (SOFTGOODS) ×1
STAPLER SKIN PROX 35W (STAPLE) ×2 IMPLANT
STOCKINETTE IMPERVIOUS 9X36 MD (GAUZE/BANDAGES/DRESSINGS) ×2 IMPLANT
SUT ETHILON 2 0 FS 18 (SUTURE) ×2 IMPLANT
TOWEL OR 17X26 4PK STRL BLUE (TOWEL DISPOSABLE) ×2 IMPLANT
WATER STERILE IRR 500ML POUR (IV SOLUTION) ×2 IMPLANT

## 2021-08-11 NOTE — Progress Notes (Addendum)
Brief note - see H&P from earlier today  S: Patient seen in hospital room, sitting at edge of bed, no distress, reports pain in hand is "pretty bad" but medications are helping. NO CP/SOB, no fever/chills.   O: BP 119/70 (BP Location: Right Arm)   Pulse 74   Temp 98.2 F (36.8 C)   Resp 19   Ht 5\' 11"  (1.803 m)   Wt 72.6 kg   SpO2 97%   BMI 22.32 kg/m  NAD, alert. R hand swollen on dorsum, tender, mild erythema. Normal respiratory effort.   A/P: Orthopedics to see this evening, recommended IR consult for US-guided I&D. See below MRI results. Continue abx and pain control.         Results for orders placed or performed during the hospital encounter of 08/10/21 (from the past 72 hour(s))  Procalcitonin - Baseline     Status: None   Collection Time: 08/11/21  1:37 AM  Result Value Ref Range   Procalcitonin 0.53 ng/mL    Comment:        Interpretation: PCT > 0.5 ng/mL and <= 2 ng/mL: Systemic infection (sepsis) is possible, but other conditions are known to elevate PCT as well. (NOTE)       Sepsis PCT Algorithm           Lower Respiratory Tract                                      Infection PCT Algorithm    ----------------------------     ----------------------------         PCT < 0.25 ng/mL                PCT < 0.10 ng/mL          Strongly encourage             Strongly discourage   discontinuation of antibiotics    initiation of antibiotics    ----------------------------     -----------------------------       PCT 0.25 - 0.50 ng/mL            PCT 0.10 - 0.25 ng/mL               OR       >80% decrease in PCT            Discourage initiation of                                            antibiotics      Encourage discontinuation           of antibiotics    ----------------------------     -----------------------------         PCT >= 0.50 ng/mL              PCT 0.26 - 0.50 ng/mL                AND       <80% decrease in PCT             Encourage initiation of  antibiotics       Encourage continuation           of antibiotics    ----------------------------     -----------------------------        PCT >= 0.50 ng/mL                  PCT > 0.50 ng/mL               AND         increase in PCT                  Strongly encourage                                      initiation of antibiotics    Strongly encourage escalation           of antibiotics                                     -----------------------------                                           PCT <= 0.25 ng/mL                                                 OR                                        > 80% decrease in PCT                                      Discontinue / Do not initiate                                             antibiotics  Performed at [facility], [address] [place], [place] [address]   Lactic acid, plasma     Status: None   Collection Time: 08/11/21  1:37 AM  Result Value Ref Range   Lactic Acid, Venous 1.1 0.5 - 1.9 mmol/L    Comment: Performed at [facility], [address] [place], [place] [address]  Comprehensive metabolic panel     Status: Abnormal   Collection Time: 08/11/21  1:37 AM  Result Value Ref Range   Sodium 135 135 - 145 mmol/L   Potassium 3.9 3.5 - 5.1 mmol/L   Chloride 99 98 - 111 mmol/L   CO2 27 22 - 32 mmol/L   Glucose, Bld 96 70 - 99 mg/dL    Comment: Glucose reference range applies only to samples taken after fasting for at least 8 hours.   BUN 6 6 - 20 mg/dL   Creatinine, Ser 0.98 (L) 0.61 - 1.24 mg/dL   Calcium 9.0 8.9 - 11.9 mg/dL  Total Protein 8.1 6.5 - 8.1 g/dL   Albumin 4.0 3.5 - 5.0 g/dL   AST 825 (H) 15 - 41 U/L   ALT 72 (H) 0 - 44 U/L   Alkaline Phosphatase 60 38 - 126 U/L   Total Bilirubin 0.8 0.3 - 1.2 mg/dL   GFR, Estimated >05 >39 mL/min    Comment: (NOTE) Calculated using the CKD-EPI Creatinine Equation (2021)    Anion gap 9 5 - 15    Comment:  Performed at [facility], [address] [place], [place] [address]  CBC with Differential     Status: None   Collection Time: 08/11/21  1:37 AM  Result Value Ref Range   WBC 5.5 4.0 - 10.5 K/uL   RBC 5.08 4.22 - 5.81 MIL/uL   Hemoglobin 15.4 13.0 - 17.0 g/dL   HCT 19.3 79.0 - 24.0 %   MCV 89.2 80.0 - 100.0 fL   MCH 30.3 26.0 - 34.0 pg   MCHC 34.0 30.0 - 36.0 g/dL   RDW 97.3 53.2 - 99.2 %   Platelets 255 150 - 400 K/uL   nRBC 0.0 0.0 - 0.2 %   Neutrophils Relative % 47 %   Neutro Abs 2.6 1.7 - 7.7 K/uL   Lymphocytes Relative 36 %   Lymphs Abs 2.0 0.7 - 4.0 K/uL   Monocytes Relative 15 %   Monocytes Absolute 0.8 0.1 - 1.0 K/uL   Eosinophils Relative 2 %   Eosinophils Absolute 0.1 0.0 - 0.5 K/uL   Basophils Relative 0 %   Basophils Absolute 0.0 0.0 - 0.1 K/uL   Immature Granulocytes 0 %   Abs Immature Granulocytes 0.01 0.00 - 0.07 K/uL    Comment: Performed at [facility], [address] [place], [place] [address]  Protime-INR     Status: None   Collection Time: 08/11/21  1:37 AM  Result Value Ref Range   Prothrombin Time 13.0 11.4 - 15.2 seconds   INR 1.0 0.8 - 1.2    Comment: (NOTE) INR goal varies based on device and disease states. Performed at [facility], [address] [place], [place] [address]   APTT     Status: None   Collection Time: 08/11/21  1:37 AM  Result Value Ref Range   aPTT 34 24 - 36 seconds    Comment: Performed at [facility], [address] [place], [place] [address]  Blood culture (routine single)     Status: None (Preliminary result)   Collection Time: 08/11/21  1:37 AM   Specimen: BLOOD  Result Value Ref Range   Specimen Description BLOOD RIGHT UPPER ARM    Special Requests      BOTTLES DRAWN AEROBIC AND ANAEROBIC Blood Culture adequate volume   Culture      NO GROWTH < 12 HOURS Performed at [facility], [address], [place], [place] [address]    Report Status PENDING   Lactic  acid, plasma     Status: None   Collection Time: 08/11/21  5:29 AM  Result Value Ref Range   Lactic Acid, Venous 1.8 0.5 - 1.9 mmol/L    Comment: Performed at [facility], [address] [place], [place] [address]    MR HAND RIGHT W WO CONTRAST  Result Date: 08/11/2021 CLINICAL DATA:  Soft tissue mass of right hand. Right dorsal hand pain and swelling over the past couple of days. No trauma. Remote right hand fracture months to a year ago.  EXAM: MRI OF THE RIGHT HAND WITHOUT AND WITH CONTRAST TECHNIQUE: Multiplanar, multisequence MR imaging of the right hand was performed before and after the administration of intravenous contrast. CONTRAST:  7mL GADAVIST GADOBUTROL 1 MMOL/ML IV SOLN COMPARISON:  Right hand radiographs 08/10/2021 FINDINGS: Bones/Joint/Cartilage There is cortical thickening from healing of a fracture of the proximal diaphysis of fifth metacarpal seen on prior radiographs. There is mild associated marrow edema within the metacarpal shaft suggesting incomplete healing at this time. There is associated foreshortening of the fifth metacarpal shaft. There are moderate degenerative osteophytes at the dorsal aspect of the fifth carpometacarpal joint, likely posttraumatic in etiology. Ligaments The metacarpophalangeal and interphalangeal medial and lateral collateral ligaments appear intact. Muscles and Tendons Mild extensor carpi radialis longus and brevis and mild fourth dorsal extensor compartment tenosynovitis. Soft tissues Deep to the extensor carpi radialis brevis tendon in the fourth dorsal extensor tendon compartment at the level of the midcarpal joint, there is decreased T1 increased T2 signal fluid with mild peripheral border enhancement (axial images 4-7) and irregular borders. This corresponds to the fluid seen on ultrasound. This may represent a moderate midcarpal joint effusion with peripheral enhancement suspicious for synovitis and possible joint infection. This also could  represent an abscess within the soft tissues just dorsal to the midcarpal joint. Abscess just dorsal to the midcarpal row. This measures up to approximately 3.6 by 0.9 by 1.1 cm (transverse by short axis of the hand by long axis of the hand). There is additional teardrop shaped decreased T1 and increased T2 signal fluid without definite peripheral enhancement extending dorsally from the fourth dorsal extensor compartment tendon sheath at the dorsal medial aspect of the fourth metacarpophalangeal joint (axial series 6, image 21), a possible ganglion. There is moderate edema and swelling within the subcutaneous fat of the dorsal wrist and hand of the level of the metacarpophalangeal joints, greatest at the level of the distal fourth and second metacarpal shafts. IMPRESSION: 1. Moderate edema and swelling within the subcutaneous fat of the dorsal hand suggesting cellulitis. 2. Walled-off fluid just dorsal to the midcarpal joint with thin peripheral wall enhancement. This may represent a moderate joint effusion with synovial enhancement, concerning for infection within the joint fluid (septic joint). A walled-off abscess just dorsal to the midcarpal row and deep to the extensor tendons is also possible. 3. Small teardrop-shaped fluid extending dorsally from the fourth digit extensor tendons near the metacarpophalangeal joint, a possible nonspecific ganglion. 4. Mild extensor carpi radialis longus and brevis and mild fourth dorsal extensor compartment tenosynovitis. Cannot exclude the presence of infection within this fluid. 5. Partially healed fracture of the proximal shaft of fifth metacarpal, as seen on prior radiographs. There is edema within the fifth metacarpal shaft suggesting incomplete healing at this time. Electronically Signed   By: Neita Garnet M.D.   On: 08/11/2021 13:15   DG Hand Complete Right  Result Date: 08/10/2021 CLINICAL DATA:  Right hand pain, swelling EXAM: RIGHT HAND - COMPLETE 3+ VIEW  COMPARISON:  None Available. FINDINGS: Mild widening of the distal radioulnar joint, possibly posttraumatic in nature. No acute fracture or dislocation. Healing proximal diaphyseal fracture of the right fifth metacarpal identified with mild shortening. Mild volar angulation of the distal fracture fragment. 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 Extensive soft tissue swelling of the right hand. No retained radiopaque foreign body. IMPRESSION: Soft tissue swelling.  No acute fracture or dislocation. Healing right fifth metacarpal fracture demonstrating residual volar angulation. Moderate degenerate arthritis  of the right fifth carpometacarpal joint, likely posttraumatic in nature. Electronically Signed   By: Helyn NumbersAshesh  Parikh M.D.   On: 08/10/2021 23:39   US RT UPPER EXTREM LTD SOFT TISSUE NON VASCULAR  Result Date: 08/11/2021 CLINICAL DATA:  Swelling along the dorsum of the hand. Concern for abscess. In EXAM: ULTRASOUND RIGHT UPPER EXTREMITY LIMITED TECHNIQUE: Ultrasound examination of the upper extremity soft tissues was performed in the area of clinical concern. COMPARISON:  None Available. FINDINGS: Ultrasound performed along the dorsum of the hand. This demonstrates a small fluid collection deep within the dorsum of the hand measuring 1.3 x 3.3 x 1.0 cm. This is concerning for deep tissue abscess. IMPRESSION: 3.3 x 1.3 x 1.0 cm fluid collection concerning for deep tissue abscess along the dorsum of the hand. Electronically Signed   By: Charlett NoseKevin  Dover M.D.   On: 08/11/2021 01:36

## 2021-08-11 NOTE — Assessment & Plan Note (Addendum)
Alcohol withdrawal prevention protocol 

## 2021-08-11 NOTE — TOC Progression Note (Signed)
Transition of Care [facility]) - Progression Note    Patient Details  Name: Douglas Collins MRN: [id number] Date of Birth: 20-Oct-1985  Transition of Care [facility]) CM/SW Contact  Marlowe Sax, RN Phone Number: 08/11/2021, 11:05 AM  Clinical Narrative:   Patient from home where he is independent, He drinks usually 4-6 beers a day and has occasional IV Drug use  He is to have a procedure on his hand this evening  TOC to monitor for needs   Expected Discharge Plan: Home/Self Care Barriers to Discharge: Continued Medical Work up  Expected Discharge Plan and Services Expected Discharge Plan: Home/Self Care                                               Social Determinants of Health (SDOH) Interventions    Readmission Risk Interventions     View : No data to display.

## 2021-08-11 NOTE — Assessment & Plan Note (Signed)
IV Rocephin and vancomycin, today 08/14/21 will be Day 4 IV abx Pain control - Rx as well as elevation and ice S/p US guided I&D 05/26 Await cultures to determine po abx on discharge - reincubated for better growth Will message ortho - appreciate recs re: repeat imaging needed given lack of resolution

## 2021-08-11 NOTE — Plan of Care (Signed)

## 2021-08-11 NOTE — ED Notes (Addendum)
Pt states he has been having pain in his right hand that started about two days ago. Pt states that today he noticed swelling and redness in his hand. Pt c/o pain that radiates up to his arm. Pt denies any injury to the hand.   Pt's right hand is swollen and has redness.

## 2021-08-11 NOTE — Progress Notes (Signed)
Pharmacy Antibiotic Note  Douglas Collins is a 36 y.o. male admitted on 08/10/2021 with cellulitis.  Pharmacy has been consulted for Vancomycin dosing.  Plan: Vancomycin 1750 mg IV X 1 given in ED on 5/25 @ 0239. Vancomycin 1500 mg IV Q12H ordered to start on 5/26 @ 1430.  AUC = 494.1 Vanc trough = 11.3   Height: 5\' 11"  (180.3 cm) Weight: 72.6 kg (160 lb) IBW/kg (Calculated) : 75.3  Temp (24hrs), Avg:98.2 F (36.8 C), Min:98.2 F (36.8 C), Max:98.2 F (36.8 C)  Recent Labs  Lab 08/11/21 0137  WBC 5.5  CREATININE 0.54*  LATICACIDVEN 1.1    Estimated Creatinine Clearance: 129.8 mL/min (A) (by C-G formula based on SCr of 0.54 mg/dL (L)).    No Known Allergies  Antimicrobials this admission:   >>    >>   Dose adjustments this admission:   Microbiology results:  BCx:   UCx:    Sputum:    MRSA PCR:   Thank you for allowing pharmacy to be a part of this patient's care.  Jessica Seidman D 08/11/2021 2:56 AM

## 2021-08-11 NOTE — Assessment & Plan Note (Signed)
-  Nicotine patch 

## 2021-08-11 NOTE — H&P (Signed)
History and Physical    Patient: Douglas Collins [id number] DOB: 07/18/1985 DOA: 08/10/2021 DOS: the patient was seen and examined on 08/11/2021 PCP: Pcp, No  Patient coming from: Home  Chief Complaint:  Chief Complaint  Patient presents with   Hand Pain    HPI: JARRETTE BAICH is a 36 y.o. male with medical history significant for Hepatitis C, tobacco use disorder, alcohol use disorder drinking 4 cans of beer daily, and occasional IV drug use, recently admitted on 07/19/2021 for bilateral lower extremity cellulitis, signing out AMA the following day, but with negative blood cultures at that time, who presents to the ED with swelling to the dorsal aspect of the right hand for the past 2 days.  He denies any injury to the area.  Reports feeling chills and subjective fever. ED course and data review: Vitals within normal limits.  CBC within normal limits and lactic acid 1.1.  CMP notable for mildly elevated AST/ALT of 140/72.  Procalcitonin in progress.  EKG, personally viewed and interpreted: NSR at 78 with no acute ST-T wave changes.  X-ray of the right hand showed soft tissue swelling with a few other nonacute findings.  Ultrasound of the right upper extremity shows the following: IMPRESSION: 3.3 x 1.3 x 1.0 cm fluid collection concerning for deep tissue abscess along the dorsum of the hand Patient started on Rocephin and vancomycin given a dose of Toradol and hospitalist consulted for admission.   Review of Systems: As mentioned in the history of present illness. All other systems reviewed and are negative. Past Medical History:  Diagnosis Date   Hepatitis C    Past Surgical History:  Procedure Laterality Date   TONSILLECTOMY Bilateral 1989   age 36 per pt   Social History:  reports that he has been smoking cigarettes. He has a 13.00 pack-year smoking history. He has quit using smokeless tobacco.  His smokeless tobacco use included chew. He reports current alcohol use. He  reports that he does not currently use drugs.  No Known Allergies  No family history on file.  Prior to Admission medications   Not on File    Physical Exam: Vitals:   08/10/21 2302 08/10/21 2308 08/11/21 0010  BP:  123/83   Pulse:  92   Resp:  16   Temp:   98.2 F (36.8 C)  TempSrc:   Oral  SpO2:  98%   Weight: 72.6 kg    Height: 5\' 11"  (1.803 m)     Physical Exam Vitals and nursing note reviewed.  Constitutional:      General: He is not in acute distress. HENT:     Head: Normocephalic and atraumatic.  Cardiovascular:     Rate and Rhythm: Normal rate and regular rhythm.     Heart sounds: Normal heart sounds.  Pulmonary:     Effort: Pulmonary effort is normal.     Breath sounds: Normal breath sounds.  Abdominal:     Palpations: Abdomen is soft.     Tenderness: There is no abdominal tenderness.  Musculoskeletal:     Right hand: Swelling present.     Comments: Tender dome-shaped swelling dorsum of right hand with erythema and warmth  Neurological:     Mental Status: Mental status is at baseline.     Data Reviewed: Relevant notes from primary care and specialist visits, past discharge summaries as available in EHR, including Care Everywhere. Prior diagnostic testing as pertinent to current admission diagnoses Updated medications and problem lists for  reconciliation ED course, including vitals, labs, imaging, treatment and response to treatment Triage notes, nursing and pharmacy notes and ED provider's notes Notable results as noted in HPI   Assessment and Plan: * Abscess of dorsum of hand, right IV Rocephin and vancomycin Pain control Orthopedics consulted We will keep patient n.p.o. for possible procedure  History of intravenous drug abuse ([place]) Intermittent use.  Counseled on complete abstinence  Tobacco use disorder Nicotine patch  Alcohol use disorder Alcohol withdrawal prevention protocol  Hepatitis C Not currently on  treatment       Advance Care Planning:   Code Status: Prior   Consults: Orthopedics, Dr. Renee Harder  Family Communication: none  Severity of Illness: The appropriate patient status for this patient is INPATIENT. Inpatient status is judged to be reasonable and necessary in order to provide the required intensity of service to ensure the patient's safety. The patient's presenting symptoms, physical exam findings, and initial radiographic and laboratory data in the context of their chronic comorbidities is felt to place them at high risk for further clinical deterioration. Furthermore, it is not anticipated that the patient will be medically stable for discharge from the hospital within 2 midnights of admission.   * I certify that at the point of admission it is my clinical judgment that the patient will require inpatient hospital care spanning beyond 2 midnights from the point of admission due to high intensity of service, high risk for further deterioration and high frequency of surveillance required.*  Author: Athena Masse, MD 08/11/2021 2:36 AM  For on call review [email or web address].

## 2021-08-11 NOTE — Assessment & Plan Note (Signed)
Not currently on treatment 

## 2021-08-11 NOTE — ED Provider Notes (Signed)
[facility] Provider Note    Event Date/Time   First MD Initiated Contact with Patient 08/10/21 2326     (approximate)   History   Hand Pain   HPI  Douglas Collins is a 36 y.o. male who presents to the ED for evaluation of Hand Pain   I reviewed medical DC summary from 5/4 where patient was admitted at our facility for 1 day but left AMA.  He was admitted for cellulitis of his bilateral feet in the setting of hepatitis C and polysubstance abuse.  Blood cultures from this visit without any growth.  Patient returns to the ED for evaluation of right dorsal hand pain and swelling atraumatically over the past couple days.  He reports no trauma or injuries happened.  Reports he remotely broke this hand months-years ago but has had no swelling or issues until the past couple days.  Reports he does occasionally use intravenous drugs, but [facility] and [facility] his hand or fingers.  Reports it has "been a while" since last IVDU.  He does report feeling systemic symptoms with chills and subjective fevers.  Physical Exam   Triage Vital Signs: ED Triage Vitals  Enc Vitals Group     BP 08/10/21 2308 123/83     Pulse Rate 08/10/21 2308 92     Resp 08/10/21 2308 16     Temp 08/11/21 0010 98.2 F (36.8 C)     Temp Source 08/11/21 0010 Oral     SpO2 08/10/21 2308 98 %     Weight 08/10/21 2302 160 lb (72.6 kg)     Height 08/10/21 2302 5\' 11"  (1.803 m)     Head Circumference --      Peak Flow --      Pain Score 08/10/21 2302 7     Pain Loc --      Pain Edu? --      Excl. in GC? --     Most recent vital signs: Vitals:   08/10/21 2308 08/11/21 0010  BP: 123/83   Pulse: 92   Resp: 16   Temp:  98.2 F (36.8 C)  SpO2: 98%     General: Awake, no distress.  Sitting up in bed, well-appearing, pleasant and conversational. CV:  Good peripheral perfusion.  Resp:  Normal effort.  Abd:  No distention.  MSK:  Right hand dorsal soft tissue swelling  and tenderness to palpation.  No areas of trauma, signs of bites, track marks or injuries to this area. No significant swelling or skin changes to the palmar side.  No [place] signs.  When gently holding his palmar hand, passively flexing his wrist and fingers did not precipitate much pain.  Fingers are neurovascularly intact with brisk capillary refill. Dorsal swelling does not seem to extend beyond the wrist proximally Neuro:  No focal deficits appreciated. Other:     ED Results / Procedures / Treatments   Labs (all labs ordered are listed, but only abnormal results are displayed) Labs Reviewed  CULTURE, BLOOD (SINGLE)  LACTIC ACID, PLASMA  CBC WITH DIFFERENTIAL/PLATELET  PROTIME-INR  APTT  PROCALCITONIN  LACTIC ACID, PLASMA  COMPREHENSIVE METABOLIC PANEL  URINALYSIS, COMPLETE (UACMP) WITH MICROSCOPIC    EKG Sinus rhythm, rate of 78 bpm.  Normal axis and intervals.  No evidence of acute ischemia.  RADIOLOGY Plain film of the right hand interpreted by me with dorsal soft tissue swelling without evidence of acute fracture or foreign body  Superficial ultrasound of the dorsum of the right hand interpreted by me with evidence of a deep abscess  Official radiology report(s): DG Hand Complete Right  Result Date: 08/10/2021 CLINICAL DATA:  Right hand pain, swelling EXAM: RIGHT HAND - COMPLETE 3+ VIEW COMPARISON:  None Available. FINDINGS: Mild widening of the distal radioulnar joint, possibly posttraumatic in nature. No acute fracture or dislocation. Healing proximal diaphyseal fracture of the right fifth metacarpal identified with mild shortening. Mild volar angulation of the distal fracture fragment. 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 Extensive soft tissue swelling of the right hand. No retained radiopaque foreign body. IMPRESSION: Soft tissue swelling.  No acute fracture or dislocation. Healing right fifth metacarpal fracture demonstrating  residual volar angulation. 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 Electronically Signed   By: Helyn Numbers M.D.   On: 08/10/2021 23:39   [place] RT UPPER EXTREM LTD SOFT TISSUE NON VASCULAR  Result Date: 08/11/2021 CLINICAL DATA:  Swelling along the dorsum of the hand. Concern for abscess. In EXAM: ULTRASOUND RIGHT UPPER EXTREMITY LIMITED TECHNIQUE: Ultrasound examination of the upper extremity soft tissues was performed in the area of clinical concern. COMPARISON:  None Available. FINDINGS: Ultrasound performed along the dorsum of the hand. This demonstrates a small fluid collection deep within the dorsum of the hand measuring 1.3 x 3.3 x 1.0 cm. This is concerning for deep tissue abscess. IMPRESSION: 3.3 x 1.3 x 1.0 cm fluid collection concerning for deep tissue abscess along the dorsum of the hand. Electronically Signed   By: Charlett Nose M.D.   On: 08/11/2021 01:36    PROCEDURES and INTERVENTIONS:  .1-3 Lead EKG Interpretation Performed by: Delton Prairie, MD Authorized by: Delton Prairie, MD     Interpretation: normal     ECG rate:  90   ECG rate assessment: normal     Rhythm: sinus rhythm     Ectopy: none     Conduction: normal    Medications  vancomycin (VANCOREADY) IVPB 1750 mg/350 mL (has no administration in time range)  cefTRIAXone (ROCEPHIN) 2 g in sodium chloride 0.9 % 100 mL IVPB (2 g Intravenous New Bag/Given 08/11/21 0156)  chlordiazePOXIDE (LIBRIUM) capsule 25 mg (25 mg Oral Given 08/11/21 0149)  ketorolac (TORADOL) 30 MG/ML injection 15 mg (15 mg Intravenous Given 08/11/21 0151)  morphine (PF) 4 MG/ML injection 4 mg (4 mg Intravenous Given 08/11/21 0153)     IMPRESSION / MDM / ASSESSMENT AND PLAN / ED COURSE  I reviewed the triage vital signs and the nursing notes.  Differential diagnosis includes, but is not limited to, dorsal hand abscess, cellulitis, flexor tenosynovitis, trauma, bacteremia, septic  joint  {Patient presents with symptoms of an acute illness or injury that is potentially life-threatening.  36 year old IV drug user presents to the ED with evidence of dorsal hand abscess.  Is not meeting SIRS/sepsis criteria as he has a normal WBC.  He looks systemically well and has reassuring vital signs.  X-ray without evidence of foreign body or bony pathology, and superficial ultrasound does confirm a subcutaneous abscess.  No signs of palmar pathology such as flexor tenosynovitis.  We will start IV antibiotics and consult with medicine for admission.  Clinical Course as of 08/11/21 0221  Fri Aug 11, 2021  0052 Reassessed.  Patient is an ultrasound and his girlfriend remains at the bedside.  She reports that he is already trying to leave AMA [DS]  0141 Reassessed and discussed ultrasound  results.  We discussed deep dorsal hand abscess and my strong recommendation to actually stay for IV antibiotics and orthopedic evaluation.  He is agreeable. [DS]    Clinical Course User Index [DS] Delton Prairie, MD     FINAL CLINICAL IMPRESSION(S) / ED DIAGNOSES   Final diagnoses:  Abscess of dorsum of right hand     Rx / DC Orders   ED Discharge Orders     None        Note:  This document was prepared using Dragon voice recognition software and may include unintentional dictation errors.   Delton Prairie, MD 08/11/21 Earle Gell

## 2021-08-11 NOTE — Assessment & Plan Note (Signed)
Intermittent use.  Counseled on complete abstinence

## 2021-08-12 LAB — PROCALCITONIN: Procalcitonin: 0.21 ng/mL

## 2021-08-12 LAB — BASIC METABOLIC PANEL
Anion gap: 12 (ref 5–15)
BUN: 7 mg/dL (ref 6–20)
CO2: 26 mmol/L (ref 22–32)
Calcium: 8.7 mg/dL — ABNORMAL LOW (ref 8.9–10.3)
Chloride: 103 mmol/L (ref 98–111)
Creatinine, Ser: 0.51 mg/dL — ABNORMAL LOW (ref 0.61–1.24)
GFR, Estimated: >60 mL/min (ref >60)
Glucose, Bld: 105 mg/dL — ABNORMAL HIGH (ref 70–99)
Potassium: 3.3 mmol/L — ABNORMAL LOW (ref 3.5–5.1)
Sodium: 141 mmol/L (ref 135–145)

## 2021-08-12 LAB — CBC
HCT: 43.7 % (ref 39.0–52.0)
Hemoglobin: 14.6 g/dL (ref 13.0–17.0)
MCH: 29.7 pg (ref 26.0–34.0)
MCHC: 33.4 g/dL (ref 30.0–36.0)
MCV: 89 fL (ref 80.0–100.0)
Platelets: 220 10*3/uL (ref 150–400)
RBC: 4.91 MIL/uL (ref 4.22–5.81)
RDW: 14.1 % (ref 11.5–15.5)
WBC: 4.3 10*3/uL (ref 4.0–10.5)
nRBC: 0 % (ref 0.0–0.2)

## 2021-08-12 NOTE — Consult Note (Signed)
ORTHOPAEDIC CONSULTATION  REQUESTING PHYSICIAN: Emeterio Reeve, DO  Chief Complaint: Right hand swelling  HPI: Douglas Collins is a 36 y.o. male who complains of pain and swelling along the dorsum of the right hand.  Patient was admitted early a.m. 5/26 and started on IV antibiotics.  Bedside ultrasound showed concern for possible fluid collection the dorsal aspect of the hand.  MRI was subsequently obtained. Recommendation was made for IR guided aspiration of fluid collection, which was performed 5/26 afternoon. This morning, patient is resting comfortably.  He states that pain, swelling, erythema are all slowly improving.  Past Medical History:  Diagnosis Date   Hepatitis C    Past Surgical History:  Procedure Laterality Date   TONSILLECTOMY Bilateral 1989   age 69 per pt   Social History   Socioeconomic History   Marital status: Single    Spouse name: Not on file   Number of children: Not on file   Years of education: Not on file   Highest education level: Not on file  Occupational History   Not on file  Tobacco Use   Smoking status: Some Days    Packs/day: 0.50    Years: 26.00    Pack years: 13.00    Types: Cigarettes   Smokeless tobacco: Former    Types: Nurse, children's Use: Never used  Substance and Sexual Activity   Alcohol use: Yes    Comment: two 24 ounce beers a day since Friday   Drug use: Not Currently    Comment: 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 no other drug use in last 5 1/2 years   Sexual activity: Yes  Other Topics Concern   Not on file  Social History Narrative   Not on file   Social Determinants of Health   Financial Resource Strain: Not on file  Food Insecurity: Not on file  Transportation Needs: Not on file  Physical Activity: Not on file  Stress: Not on file  Social Connections: Not on file   No family history on file. No Known Allergies Prior to Admission medications   Not on File   MR HAND RIGHT W WO  CONTRAST  Result Date: 08/11/2021 CLINICAL DATA:  Soft tissue mass of right hand. Right dorsal hand pain and swelling over the past couple of days. No trauma. Remote right hand fracture months to a year ago. EXAM: MRI OF THE RIGHT HAND WITHOUT AND WITH CONTRAST TECHNIQUE: Multiplanar, multisequence MR imaging of the right hand was performed before and after the administration of intravenous contrast. CONTRAST:  28mL GADAVIST GADOBUTROL 1 MMOL/ML IV SOLN COMPARISON:  Right hand radiographs 08/10/2021 FINDINGS: Bones/Joint/Cartilage There is cortical thickening from healing of a fracture of the proximal diaphysis of fifth metacarpal seen on prior radiographs. There is mild associated marrow edema within the metacarpal shaft suggesting incomplete healing at this time. There is associated foreshortening of the fifth metacarpal shaft. There are moderate degenerative osteophytes at the dorsal aspect of the fifth carpometacarpal joint, likely posttraumatic in etiology. Ligaments The metacarpophalangeal and interphalangeal medial and lateral collateral ligaments appear intact. Muscles and Tendons Mild extensor carpi radialis longus and brevis and mild fourth dorsal extensor compartment tenosynovitis. Soft tissues Deep to the extensor carpi radialis brevis tendon in the fourth dorsal extensor tendon compartment at the level of the midcarpal joint, there is decreased T1 increased T2 signal fluid with mild peripheral border enhancement (axial images 4-7) and irregular borders. This corresponds to the fluid seen on ultrasound. This  may represent a moderate midcarpal joint effusion with peripheral enhancement suspicious for synovitis and possible joint infection. This also could represent an abscess within the soft tissues just dorsal to the midcarpal joint. Abscess just dorsal to the midcarpal row. This measures up to approximately 3.6 by 0.9 by 1.1 cm (transverse by short axis of the hand by long axis of the hand). There is  additional teardrop shaped decreased T1 and increased T2 signal fluid without definite peripheral enhancement extending dorsally from the fourth dorsal extensor compartment tendon sheath at the dorsal medial aspect of the fourth metacarpophalangeal joint (axial series 6, image 21), a possible ganglion. There is moderate edema and swelling within the subcutaneous fat of the dorsal wrist and hand of the level of the metacarpophalangeal joints, greatest at the level of the distal fourth and second metacarpal shafts. IMPRESSION: 1. Moderate edema and swelling within the subcutaneous fat of the dorsal hand suggesting cellulitis. 2. Walled-off fluid just dorsal to the midcarpal joint with thin peripheral wall enhancement. This may represent a moderate joint effusion with synovial enhancement, concerning for infection within the joint fluid (septic joint). A walled-off abscess just dorsal to the midcarpal row and deep to the extensor tendons is also possible. 3. Small teardrop-shaped fluid extending dorsally from the fourth digit extensor tendons near the metacarpophalangeal joint, a possible nonspecific ganglion. 4. Mild extensor carpi radialis longus and brevis and mild fourth dorsal extensor compartment tenosynovitis. Cannot exclude the presence of infection within this fluid. 5. Partially healed fracture of the proximal shaft of fifth metacarpal, as seen on prior radiographs. There is edema within the fifth metacarpal shaft suggesting incomplete healing at this time. Electronically Signed   By: Neita Garnet M.D.   On: 08/11/2021 13:15   DG Hand Complete Right  Result Date: 08/10/2021 CLINICAL DATA:  Right hand pain, swelling EXAM: RIGHT HAND - COMPLETE 3+ VIEW COMPARISON:  None Available. FINDINGS: Mild widening of the distal radioulnar joint, possibly posttraumatic in nature. No acute fracture or dislocation. Healing proximal diaphyseal fracture of the right fifth metacarpal identified with mild shortening. Mild  volar angulation of the distal fracture fragment. 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 Extensive soft tissue swelling of the right hand. No retained radiopaque foreign body. IMPRESSION: Soft tissue swelling.  No acute fracture or dislocation. Healing right fifth metacarpal fracture demonstrating residual volar angulation. 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 Electronically Signed   By: Helyn Numbers M.D.   On: 08/10/2021 23:39   [place] DRAIN/INJ INTER JOINT/BURSA  Result Date: 08/11/2021 INDICATION: Concern for abscess of the right hand EXAM: Ultrasound-guided aspiration of right hand fluid collection MEDICATIONS: None. ANESTHESIA/SEDATION: Local analgesia COMPLICATIONS: None immediate. PROCEDURE: Informed written consent was obtained from the patient after a thorough discussion of the procedural risks, benefits and alternatives. All questions were addressed. Maximal Sterile Barrier Technique was utilized including caps, mask, sterile gowns, sterile gloves, sterile drape, hand hygiene and skin antiseptic. A timeout was performed prior to the initiation of the procedure. The patient's hand was placed on the exam table. Focused sonographic exam of the dorsum of the right hand demonstrated a small anechoic fluid collection just deep to the subcutaneous tissues measuring 1.6 x 1.3 x 0.8 cm. Skin entry site was marked, and the overlying skin was prepped and draped in the standard sterile fashion. Local analgesia was obtained with 1% lidocaine. Using ultrasound guidance, aspiration of the fluid collection was performed using a 21 gauge  needle. Approximately 1 mL of thick serous fluid was aspirated. The sample was sent to the lab for analysis. A clean dressing was placed after hemostasis. The patient tolerated the procedure without immediate complication. IMPRESSION: Successful ultrasound-guided aspiration of fluid  collection in the dorsum of the right hand yielding approximately 1 mL of thick serous fluid. Sample sent for microbiology analysis. Electronically Signed   By: Albin Felling M.D.   On: 08/11/2021 16:26   [place] RT UPPER EXTREM LTD SOFT TISSUE NON VASCULAR  Result Date: 08/11/2021 CLINICAL DATA:  Swelling along the dorsum of the hand. Concern for abscess. In EXAM: ULTRASOUND RIGHT UPPER EXTREMITY LIMITED TECHNIQUE: Ultrasound examination of the upper extremity soft tissues was performed in the area of clinical concern. COMPARISON:  None Available. FINDINGS: Ultrasound performed along the dorsum of the hand. This demonstrates a small fluid collection deep within the dorsum of the hand measuring 1.3 x 3.3 x 1.0 cm. This is concerning for deep tissue abscess. IMPRESSION: 3.3 x 1.3 x 1.0 cm fluid collection concerning for deep tissue abscess along the dorsum of the hand. Electronically Signed   By: Rolm Baptise M.D.   On: 08/11/2021 01:36    Positive ROS: All other systems have been reviewed and were otherwise negative with the exception of those mentioned in the HPI and as above.  Physical Exam: General: Alert, no acute distress Cardiovascular: No pedal edema Respiratory: No cyanosis, no use of accessory musculature GI: No organomegaly, abdomen is soft and non-tender Skin: No lesions in the area of chief complaint Neurologic: Sensation intact distally Psychiatric: Patient is competent for consent with normal mood and affect Lymphatic: No axillary or cervical lymphadenopathy  MUSCULOSKELETAL:  Right hand: No significant erythema.  Mild swelling through the dorsum of the hand, no fluctuance no specific areas of tenderness to palpation, no tenderness of the palmar aspect of the hand or along the flexor tendon sheaths or along the digits or MCP, PIP, DIP joints, patient able to actively flex and extend at the wrist able to actively flex and extend all digits, unable to make composite fist secondary to  pain/tightness in the hand, sensation intact throughout the hand   Assessment: 36 year old right-hand-dominant male admitted with soft tissue infection of the right hand, status post ultrasound-guided aspiration with IR  Plan: -Appreciate hospitalist team support -Recommend continued elevation of the hand to decrease pain and swelling -Continue IV antibiotics and follow culture results from IR aspirate -No surgical indication at this time.  If patient continues to improve, anticipate discharge on p.o. antibiotics    Renee Harder, MD    08/12/2021 7:12 AM

## 2021-08-12 NOTE — Progress Notes (Signed)
PROGRESS NOTE    Douglas Collins  GNF:[id number] DOB: 02/05/1986  DOA: 08/10/2021 Date of Service: 08/12/21 PCP: Oneita HurtPcp, No     Brief Narrative / Hospital Course:  To ED w/ hand pain late 08/10/21, admitted 08/11/21 for cellulitis/abscess dorsum R hand. Started on IV Rocephin and Vancomycin. Orthopedics consulted. US and MRI done. US guided drainage 05/26. Today 08/12/21 will be Day 2 IV abx   Consultants:  Orthopedics Interventional Radiology   Procedures: 08/11/21: IR Ultrasound-guided aspiration of right hand fluid collection    Subjective: Patient reports pain in hand but no fever/chills, no nausea. He is up walking, no lightheadedness, no CP/SOB      ASSESSMENT & PLAN:   Principal Problem:   Abscess of dorsum of hand, right Active Problems:   Hepatitis C   Alcohol use disorder   Tobacco use disorder   History of intravenous drug abuse (HCC)   Abscess of dorsum of hand, right IV Rocephin and vancomycin Pain control Orthopedics consulted We will keep patient n.p.o. for possible procedure  Hepatitis C Not currently on treatment  Alcohol use disorder Alcohol withdrawal prevention protocol  Tobacco use disorder Nicotine patch  History of intravenous drug abuse (HCC) Intermittent use.  Counseled on complete abstinence    DVT prophylaxis: early ambulation Code Status: FULL Family Communication: support person is at bedside during rounds  Disposition Plan / TOC needs: home, no TOC needs at this time Barriers to discharge / significant pending items: pending cultures to plan for po abx on discharge hopefully 05/28 or 05/29             Objective: Vitals:   08/11/21 0816 08/11/21 1940 08/12/21 0444 08/12/21 0813  BP: 119/70 139/85 117/61 121/71  Pulse: 74 72 85 78  Resp: 19 20 20    Temp: 98.2 F (36.8 C) 98.8 F (37.1 C) 98 F (36.7 C) 98.1 F (36.7 C)  TempSrc:      SpO2: 97% 100% 97% 97%  Weight:      Height:         Intake/Output Summary (Last 24 hours) at 08/12/2021 [id number] Last data filed at 08/12/2021 [id number] Gross per 24 hour  Intake 700 ml  Output 1276 ml  Net -576 ml   Filed Weights   08/10/21 2302  Weight: 72.6 kg    Examination:  Constitutional:  VS as above General Appearance: alert, well-developed, well-nourished, mild distress d/t pain Eyes: Normal lids and conjunctive, non-icteric sclera Ears, Nose, Mouth, Throat: Normal appearance Neck: No masses, trachea midline Respiratory: Normal respiratory effort Cardiovascular: No lower extremity edema Musculoskeletal:  Gait normal R hand dorsal swelling, bandage in place, no significant erythema, (+)tenderness, normal wrist ROM Neurological: No cranial nerve deficit on limited exam Motor intact and symmetric Psychiatric: Normal judgment/insight Normal mood and affect       Scheduled Medications:   folic acid  1 mg Oral Daily   LORazepam  0-4 mg Intravenous Q6H   Followed by   Melene Muller[START ON 08/13/2021] LORazepam  0-4 mg Intravenous Q12H   multivitamin with minerals  1 tablet Oral Daily   thiamine  100 mg Oral Daily   Or   thiamine  100 mg Intravenous Daily    Continuous Infusions:  cefTRIAXone (ROCEPHIN)  IV Stopped (08/12/21 0234)   vancomycin Stopped (08/12/21 0504)    PRN Medications:  acetaminophen **OR** acetaminophen, HYDROcodone-acetaminophen, LORazepam **OR** LORazepam, morphine injection, ondansetron **OR** ondansetron (ZOFRAN) IV  Antimicrobials:  Anti-infectives (From admission, onward)    Start  Dose/Rate Route Frequency Ordered Stop   08/12/21 0200  cefTRIAXone (ROCEPHIN) 1 g in sodium chloride 0.9 % 100 mL IVPB        1 g 200 mL/hr over 30 Minutes Intravenous Every 24 hours 08/11/21 0614     08/11/21 1430  vancomycin (VANCOREADY) IVPB 1500 mg/300 mL        1,500 mg 150 mL/hr over 120 Minutes Intravenous Every 12 hours 08/11/21 0256     08/11/21 0015  vancomycin (VANCOREADY) IVPB 1750 mg/350 mL         1,750 mg 175 mL/hr over 120 Minutes Intravenous  Once 08/11/21 0011 08/11/21 0754   08/11/21 0015  cefTRIAXone (ROCEPHIN) 2 g in sodium chloride 0.9 % 100 mL IVPB        2 g 200 mL/hr over 30 Minutes Intravenous  Once 08/11/21 0011 08/11/21 0233       Data Reviewed: I have personally reviewed following labs and imaging studies  CBC: Recent Labs  Lab 08/11/21 0137 08/12/21 0447  WBC 5.5 4.3  NEUTROABS 2.6  --   HGB 15.4 14.6  HCT 45.3 43.7  MCV 89.2 89.0  PLT 255 220   Basic Metabolic Panel: Recent Labs  Lab 08/11/21 0137 08/12/21 0447  NA 135 141  K 3.9 3.3*  CL 99 103  CO2 27 26  GLUCOSE 96 105*  BUN 6 7  CREATININE 0.54* 0.51*  CALCIUM 9.0 8.7*   GFR: Estimated Creatinine Clearance: 129.8 mL/min (A) (by C-G formula based on SCr of 0.51 mg/dL (L)). Liver Function Tests: Recent Labs  Lab 08/11/21 0137  AST 140*  ALT 72*  ALKPHOS 60  BILITOT 0.8  PROT 8.1  ALBUMIN 4.0   No results for input(s): LIPASE, AMYLASE in the last 168 hours. No results for input(s): AMMONIA in the last 168 hours. Coagulation Profile: Recent Labs  Lab 08/11/21 0137  INR 1.0   Cardiac Enzymes: No results for input(s): CKTOTAL, CKMB, CKMBINDEX, TROPONINI in the last 168 hours. BNP (last 3 results) No results for input(s): PROBNP in the last 8760 hours. HbA1C: No results for input(s): HGBA1C in the last 72 hours. CBG: No results for input(s): GLUCAP in the last 168 hours. Lipid Profile: No results for input(s): CHOL, HDL, LDLCALC, TRIG, CHOLHDL, LDLDIRECT in the last 72 hours. Thyroid Function Tests: No results for input(s): TSH, T4TOTAL, FREET4, T3FREE, THYROIDAB in the last 72 hours. Anemia Panel: No results for input(s): VITAMINB12, FOLATE, FERRITIN, TIBC, IRON, RETICCTPCT in the last 72 hours. Urine analysis:    Component Value Date/Time   COLORURINE COLORLESS (A) 08/11/2021 0011   APPEARANCEUR CLEAR (A) 08/11/2021 0011   LABSPEC 1.002 (L) 08/11/2021 0011    PHURINE 6.0 08/11/2021 0011   GLUCOSEU NEGATIVE 08/11/2021 0011   HGBUR NEGATIVE 08/11/2021 0011   BILIRUBINUR NEGATIVE 08/11/2021 0011   KETONESUR NEGATIVE 08/11/2021 0011   PROTEINUR NEGATIVE 08/11/2021 0011   NITRITE NEGATIVE 08/11/2021 0011   LEUKOCYTESUR NEGATIVE 08/11/2021 0011   Sepsis Labs: @LABRCNTIP (procalcitonin:4,lacticidven:4)  Recent Results (from the past 240 hour(s))  Blood culture (routine single)     Status: None (Preliminary result)   Collection Time: 08/11/21  1:37 AM   Specimen: BLOOD  Result Value Ref Range Status   Specimen Description BLOOD RIGHT UPPER ARM  Final   Special Requests   Final    BOTTLES DRAWN AEROBIC AND ANAEROBIC Blood Culture adequate volume   Culture   Final    NO GROWTH 1 DAY Performed at [facility], [address] [place], [place] [address]    Report Status PENDING  Incomplete  Aerobic/Anaerobic Culture w Gram Stain (surgical/deep wound)     Status: None (Preliminary result)   Collection Time: 08/11/21  4:15 PM   Specimen: Abscess  Result Value Ref Range Status   Specimen Description ABSCESS  Final   Special Requests RIGHT HAND  Final   Gram Stain   Final    ABUNDANT WBC PRESENT, PREDOMINANTLY PMN NO ORGANISMS SEEN Performed at [facility], [address] [place], [place] [address]    Culture PENDING  Incomplete   Report Status PENDING  Incomplete         Radiology Studies last 96 hours: MR HAND RIGHT W WO CONTRAST  Result Date: 08/11/2021 CLINICAL DATA:  Soft tissue mass of right hand. Right dorsal hand pain and swelling over the past couple of days. No trauma. Remote right hand fracture months to a year ago. EXAM: MRI OF THE RIGHT HAND WITHOUT AND WITH CONTRAST TECHNIQUE: Multiplanar, multisequence MR imaging of the right hand was performed before and after the administration of intravenous contrast. CONTRAST:  7mL GADAVIST GADOBUTROL 1 MMOL/ML IV SOLN COMPARISON:  Right hand radiographs 08/10/2021  FINDINGS: Bones/Joint/Cartilage There is cortical thickening from healing of a fracture of the proximal diaphysis of fifth metacarpal seen on prior radiographs. There is mild associated marrow edema within the metacarpal shaft suggesting incomplete healing at this time. There is associated foreshortening of the fifth metacarpal shaft. There are moderate degenerative osteophytes at the dorsal aspect of the fifth carpometacarpal joint, likely posttraumatic in etiology. Ligaments The metacarpophalangeal and interphalangeal medial and lateral collateral ligaments appear intact. Muscles and Tendons Mild extensor carpi radialis longus and brevis and mild fourth dorsal extensor compartment tenosynovitis. Soft tissues Deep to the extensor carpi radialis brevis tendon in the fourth dorsal extensor tendon compartment at the level of the midcarpal joint, there is decreased T1 increased T2 signal fluid with mild peripheral border enhancement (axial images 4-7) and irregular borders. This corresponds to the fluid seen on ultrasound. This may represent a moderate midcarpal joint effusion with peripheral enhancement suspicious for synovitis and possible joint infection. This also could represent an abscess within the soft tissues just dorsal to the midcarpal joint. Abscess just dorsal to the midcarpal row. This measures up to approximately 3.6 by 0.9 by 1.1 cm (transverse by short axis of the hand by long axis of the hand). There is additional teardrop shaped decreased T1 and increased T2 signal fluid without definite peripheral enhancement extending dorsally from the fourth dorsal extensor compartment tendon sheath at the dorsal medial aspect of the fourth metacarpophalangeal joint (axial series 6, image 21), a possible ganglion. There is moderate edema and swelling within the subcutaneous fat of the dorsal wrist and hand of the level of the metacarpophalangeal joints, greatest at the level of the distal fourth and second  metacarpal shafts. IMPRESSION: 1. Moderate edema and swelling within the subcutaneous fat of the dorsal hand suggesting cellulitis. 2. Walled-off fluid just dorsal to the midcarpal joint with thin peripheral wall enhancement. This may represent a moderate joint effusion with synovial enhancement, concerning for infection within the joint fluid (septic joint). A walled-off abscess just dorsal to the midcarpal row and deep to the extensor tendons is also possible. 3. Small teardrop-shaped fluid extending dorsally from the fourth digit extensor tendons near the metacarpophalangeal joint, a possible nonspecific ganglion. 4. Mild extensor carpi radialis longus and brevis and mild fourth dorsal extensor compartment tenosynovitis.  Cannot exclude the presence of infection within this fluid. 5. Partially healed fracture of the proximal shaft of fifth metacarpal, as seen on prior radiographs. There is edema within the fifth metacarpal shaft suggesting incomplete healing at this time. Electronically Signed   By: Neita Garnet M.D.   On: 08/11/2021 13:15   DG Hand Complete Right  Result Date: 08/10/2021 CLINICAL DATA:  Right hand pain, swelling EXAM: RIGHT HAND - COMPLETE 3+ VIEW COMPARISON:  None Available. FINDINGS: Mild widening of the distal radioulnar joint, possibly posttraumatic in nature. No acute fracture or dislocation. Healing proximal diaphyseal fracture of the right fifth metacarpal identified with mild shortening. Mild volar angulation of the distal fracture fragment. 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 Extensive soft tissue swelling of the right hand. No retained radiopaque foreign body. IMPRESSION: Soft tissue swelling.  No acute fracture or dislocation. Healing right fifth metacarpal fracture demonstrating residual volar angulation. 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 Electronically Signed   By: Helyn Numbers M.D.   On: 08/10/2021 23:39   [place] DRAIN/INJ INTER JOINT/BURSA  Result Date: 08/11/2021 INDICATION: Concern for abscess of the right hand EXAM: Ultrasound-guided aspiration of right hand fluid collection MEDICATIONS: None. ANESTHESIA/SEDATION: Local analgesia COMPLICATIONS: None immediate. PROCEDURE: Informed written consent was obtained from the patient after a thorough discussion of the procedural risks, benefits and alternatives. All questions were addressed. Maximal Sterile Barrier Technique was utilized including caps, mask, sterile gowns, sterile gloves, sterile drape, hand hygiene and skin antiseptic. A timeout was performed prior to the initiation of the procedure. The patient's hand was placed on the exam table. Focused sonographic exam of the dorsum of the right hand demonstrated a small anechoic fluid collection just deep to the subcutaneous tissues measuring 1.6 x 1.3 x 0.8 cm. Skin entry site was marked, and the overlying skin was prepped and draped in the standard sterile fashion. Local analgesia was obtained with 1% lidocaine. Using ultrasound guidance, aspiration of the fluid collection was performed using a 21 gauge needle. Approximately 1 mL of thick serous fluid was aspirated. The sample was sent to the lab for analysis. A clean dressing was placed after hemostasis. The patient tolerated the procedure without immediate complication. IMPRESSION: Successful ultrasound-guided aspiration of fluid collection in the dorsum of the right hand yielding approximately 1 mL of thick serous fluid. Sample sent for microbiology analysis. Electronically Signed   By: Olive Bass M.D.   On: 08/11/2021 16:26   [place] RT UPPER EXTREM LTD SOFT TISSUE NON VASCULAR  Result Date: 08/11/2021 CLINICAL DATA:  Swelling along the dorsum of the hand. Concern for abscess. In EXAM: ULTRASOUND RIGHT UPPER EXTREMITY LIMITED TECHNIQUE: Ultrasound examination of the upper extremity soft tissues was performed in the area  of clinical concern. COMPARISON:  None Available. FINDINGS: Ultrasound performed along the dorsum of the hand. This demonstrates a small fluid collection deep within the dorsum of the hand measuring 1.3 x 3.3 x 1.0 cm. This is concerning for deep tissue abscess. IMPRESSION: 3.3 x 1.3 x 1.0 cm fluid collection concerning for deep tissue abscess along the dorsum of the hand. Electronically Signed   By: Charlett Nose M.D.   On: 08/11/2021 01:36            LOS: 1 day    Time spent: 35 min    Sunnie Nielsen, DO [facility] 08/12/2021, 9:37 AM   Staff may message me via secure chat in Epic  but this may not receive immediate response,  please page for urgent matters!  If 7PM-7AM, please contact night-coverage [email or web address]  Dictation software was used to generate the above note. Typos may occur and escape review, as with typed/written notes. Please contact Dr Sheppard Coil directly for clarity if needed.

## 2021-08-13 LAB — CBC
HCT: 42.6 % (ref 39.0–52.0)
Hemoglobin: 14.2 g/dL (ref 13.0–17.0)
MCH: 29.7 pg (ref 26.0–34.0)
MCHC: 33.3 g/dL (ref 30.0–36.0)
MCV: 89.1 fL (ref 80.0–100.0)
Platelets: 219 10*3/uL (ref 150–400)
RBC: 4.78 MIL/uL (ref 4.22–5.81)
RDW: 13.9 % (ref 11.5–15.5)
WBC: 4.7 10*3/uL (ref 4.0–10.5)
nRBC: 0 % (ref 0.0–0.2)

## 2021-08-13 LAB — BASIC METABOLIC PANEL
Anion gap: 7 (ref 5–15)
BUN: 9 mg/dL (ref 6–20)
CO2: 27 mmol/L (ref 22–32)
Calcium: 8.7 mg/dL — ABNORMAL LOW (ref 8.9–10.3)
Chloride: 101 mmol/L (ref 98–111)
Creatinine, Ser: 0.53 mg/dL — ABNORMAL LOW (ref 0.61–1.24)
GFR, Estimated: >60 mL/min (ref >60)
Glucose, Bld: 100 mg/dL — ABNORMAL HIGH (ref 70–99)
Potassium: 3.6 mmol/L (ref 3.5–5.1)
Sodium: 135 mmol/L (ref 135–145)

## 2021-08-13 LAB — PROCALCITONIN: Procalcitonin: 0.15 ng/mL

## 2021-08-13 MED ORDER — TRAZODONE HCL 50 MG PO TABS
50.0000 mg | ORAL_TABLET | Freq: Once | ORAL | Status: AC
Start: 2021-08-13 — End: 2021-08-13
  Administered 2021-08-13: 50 mg via ORAL
  Filled 2021-08-13: qty 1

## 2021-08-13 NOTE — Progress Notes (Signed)
PROGRESS NOTE    Douglas Collins  ZOX:[id number] DOB: 01/21/1986  DOA: 08/10/2021 Date of Service: 08/13/21 PCP: Oneita HurtPcp, No     Brief Narrative / Hospital Course:  To ED w/ hand pain late 08/10/21, admitted 08/11/21 for cellulitis/abscess dorsum R hand. Started on IV Rocephin and Vancomycin. Orthopedics consulted. US and MRI done. US guided drainage 05/26. Today 08/13/21 will be Day 3 IV abx, no growth on cultures so far.   Consultants:  Orthopedics Interventional Radiology   Procedures: 08/11/21: IR Ultrasound-guided aspiration of right hand fluid collection    Subjective: Patient reports pain in hand is a bit better today, and  no fever/chills, no nausea. He has been up walking, no lightheadedness, no CP/SOB      ASSESSMENT & PLAN:   Principal Problem:   Abscess of dorsum of hand, right Active Problems:   Hepatitis C   Alcohol use disorder   Tobacco use disorder   History of intravenous drug abuse (HCC)   Abscess of dorsum of hand, right IV Rocephin and vancomycin, today 08/13/21 will be Day 3 IV abx Pain control - Rx as well as elevation and ice S/p US guided I&D 05/26 Await cultures to determine po abx on discharge   Hepatitis C Not currently on treatment  Alcohol use disorder Alcohol withdrawal prevention protocol  Tobacco use disorder Nicotine patch  History of intravenous drug abuse (HCC) Intermittent use.  Counseled on complete abstinence    DVT prophylaxis: early ambulation Code Status: FULL Family Communication: support person is at bedside during rounds  Disposition Plan / TOC needs: home, no TOC needs at this time Barriers to discharge / significant pending items: pending cultures to plan for po abx on discharge hopefully 05/28 or 05/29             Objective: Vitals:   08/12/21 2021 08/12/21 2214 08/13/21 0400 08/13/21 0751  BP:  137/76 139/76 (!) 150/83  Pulse: 69 75 69 94  Resp:  17 17 16   Temp: 98.2 F (36.8 C) 99 F  (37.2 C) 98.4 F (36.9 C) (!) 97.5 F (36.4 C)  TempSrc: Oral Oral Oral   SpO2: 100% 100% 98% 98%  Weight:      Height:        Intake/Output Summary (Last 24 hours) at 08/13/2021 1313 Last data filed at 08/12/2021 1509 Gross per 24 hour  Intake 300 ml  Output --  Net 300 ml   Filed Weights   08/10/21 2302  Weight: 72.6 kg    Examination:  Constitutional:  VS as above General Appearance: alert, well-developed, well-nourished, mild distress d/t pain Eyes: Normal lids and conjunctive, non-icteric sclera Ears, Nose, Mouth, Throat: Normal appearance Neck: No masses, trachea midline Respiratory: Normal respiratory effort Cardiovascular: No lower extremity edema Musculoskeletal:  Gait normal R hand dorsal swelling, bandage in place, no significant erythema, (+)tenderness, normal wrist ROM Neurological: No cranial nerve deficit on limited exam Motor intact and symmetric Psychiatric: Normal judgment/insight Normal mood and affect       Scheduled Medications:   folic acid  1 mg Oral Daily   LORazepam  0-4 mg Intravenous Q12H   multivitamin with minerals  1 tablet Oral Daily   thiamine  100 mg Oral Daily   Or   thiamine  100 mg Intravenous Daily    Continuous Infusions:  cefTRIAXone (ROCEPHIN)  IV 1 g (08/13/21 0120)   vancomycin 1,500 mg (08/13/21 0307)    PRN Medications:  acetaminophen **OR** acetaminophen, HYDROcodone-acetaminophen, LORazepam **OR**  LORazepam, morphine injection, ondansetron **OR** ondansetron (ZOFRAN) IV  Antimicrobials:  Anti-infectives (From admission, onward)    Start     Dose/Rate Route Frequency Ordered Stop   08/12/21 0200  cefTRIAXone (ROCEPHIN) 1 g in sodium chloride 0.9 % 100 mL IVPB        1 g 200 mL/hr over 30 Minutes Intravenous Every 24 hours 08/11/21 0614     08/11/21 1430  vancomycin (VANCOREADY) IVPB 1500 mg/300 mL        1,500 mg 150 mL/hr over 120 Minutes Intravenous Every 12 hours 08/11/21 0256     08/11/21 0015   vancomycin (VANCOREADY) IVPB 1750 mg/350 mL        1,750 mg 175 mL/hr over 120 Minutes Intravenous  Once 08/11/21 0011 08/11/21 0754   08/11/21 0015  cefTRIAXone (ROCEPHIN) 2 g in sodium chloride 0.9 % 100 mL IVPB        2 g 200 mL/hr over 30 Minutes Intravenous  Once 08/11/21 0011 08/11/21 0233       Data Reviewed: I have personally reviewed following labs and imaging studies  CBC: Recent Labs  Lab 08/11/21 0137 08/12/21 0447 08/13/21 0350  WBC 5.5 4.3 4.7  NEUTROABS 2.6  --   --   HGB 15.4 14.6 14.2  HCT 45.3 43.7 42.6  MCV 89.2 89.0 89.1  PLT 255 220 219   Basic Metabolic Panel: Recent Labs  Lab 08/11/21 0137 08/12/21 0447 08/13/21 0350  NA 135 141 135  K 3.9 3.3* 3.6  CL 99 103 101  CO2 27 26 27   GLUCOSE 96 105* 100*  BUN 6 7 9   CREATININE 0.54* 0.51* 0.53*  CALCIUM 9.0 8.7* 8.7*   GFR: Estimated Creatinine Clearance: 129.8 mL/min (A) (by C-G formula based on SCr of 0.53 mg/dL (L)). Liver Function Tests: Recent Labs  Lab 08/11/21 0137  AST 140*  ALT 72*  ALKPHOS 60  BILITOT 0.8  PROT 8.1  ALBUMIN 4.0   No results for input(s): LIPASE, AMYLASE in the last 168 hours. No results for input(s): AMMONIA in the last 168 hours. Coagulation Profile: Recent Labs  Lab 08/11/21 0137  INR 1.0   Cardiac Enzymes: No results for input(s): CKTOTAL, CKMB, CKMBINDEX, TROPONINI in the last 168 hours. BNP (last 3 results) No results for input(s): PROBNP in the last 8760 hours. HbA1C: No results for input(s): HGBA1C in the last 72 hours. CBG: No results for input(s): GLUCAP in the last 168 hours. Lipid Profile: No results for input(s): CHOL, HDL, LDLCALC, TRIG, CHOLHDL, LDLDIRECT in the last 72 hours. Thyroid Function Tests: No results for input(s): TSH, T4TOTAL, FREET4, T3FREE, THYROIDAB in the last 72 hours. Anemia Panel: No results for input(s): VITAMINB12, FOLATE, FERRITIN, TIBC, IRON, RETICCTPCT in the last 72 hours. Urine analysis:    Component Value  Date/Time   COLORURINE COLORLESS (A) 08/11/2021 0011   APPEARANCEUR CLEAR (A) 08/11/2021 0011   LABSPEC 1.002 (L) 08/11/2021 0011   PHURINE 6.0 08/11/2021 0011   GLUCOSEU NEGATIVE 08/11/2021 0011   HGBUR NEGATIVE 08/11/2021 0011   BILIRUBINUR NEGATIVE 08/11/2021 0011   KETONESUR NEGATIVE 08/11/2021 0011   PROTEINUR NEGATIVE 08/11/2021 0011   NITRITE NEGATIVE 08/11/2021 0011   LEUKOCYTESUR NEGATIVE 08/11/2021 0011   Sepsis Labs: @LABRCNTIP (procalcitonin:4,lacticidven:4)  Recent Results (from the past 240 hour(s))  Blood culture (routine single)     Status: None (Preliminary result)   Collection Time: 08/11/21  1:37 AM   Specimen: BLOOD  Result Value Ref Range Status   Specimen Description BLOOD  RIGHT UPPER ARM  Final   Special Requests   Final    BOTTLES DRAWN AEROBIC AND ANAEROBIC Blood Culture adequate volume   Culture   Final    NO GROWTH 2 DAYS Performed at [facility], [address] [place], [place] [address]    Report Status PENDING  Incomplete  Aerobic/Anaerobic Culture w Gram Stain (surgical/deep wound)     Status: None (Preliminary result)   Collection Time: 08/11/21  4:15 PM   Specimen: Abscess  Result Value Ref Range Status   Specimen Description ABSCESS  Final   Special Requests RIGHT HAND  Final   Gram Stain   Final    ABUNDANT WBC PRESENT, PREDOMINANTLY PMN NO ORGANISMS SEEN    Culture   Final    NO GROWTH 2 DAYS Performed at [facility], [address] [place], [place] [address]    Report Status PENDING  Incomplete         Radiology Studies last 96 hours: MR HAND RIGHT W WO CONTRAST  Result Date: 08/11/2021 CLINICAL DATA:  Soft tissue mass of right hand. Right dorsal hand pain and swelling over the past couple of days. No trauma. Remote right hand fracture months to a year ago. EXAM: MRI OF THE RIGHT HAND WITHOUT AND WITH CONTRAST TECHNIQUE: Multiplanar, multisequence MR imaging of the right hand was performed before and after  the administration of intravenous contrast. CONTRAST:  7mL GADAVIST GADOBUTROL 1 MMOL/ML IV SOLN COMPARISON:  Right hand radiographs 08/10/2021 FINDINGS: Bones/Joint/Cartilage There is cortical thickening from healing of a fracture of the proximal diaphysis of fifth metacarpal seen on prior radiographs. There is mild associated marrow edema within the metacarpal shaft suggesting incomplete healing at this time. There is associated foreshortening of the fifth metacarpal shaft. There are moderate degenerative osteophytes at the dorsal aspect of the fifth carpometacarpal joint, likely posttraumatic in etiology. Ligaments The metacarpophalangeal and interphalangeal medial and lateral collateral ligaments appear intact. Muscles and Tendons Mild extensor carpi radialis longus and brevis and mild fourth dorsal extensor compartment tenosynovitis. Soft tissues Deep to the extensor carpi radialis brevis tendon in the fourth dorsal extensor tendon compartment at the level of the midcarpal joint, there is decreased T1 increased T2 signal fluid with mild peripheral border enhancement (axial images 4-7) and irregular borders. This corresponds to the fluid seen on ultrasound. This may represent a moderate midcarpal joint effusion with peripheral enhancement suspicious for synovitis and possible joint infection. This also could represent an abscess within the soft tissues just dorsal to the midcarpal joint. Abscess just dorsal to the midcarpal row. This measures up to approximately 3.6 by 0.9 by 1.1 cm (transverse by short axis of the hand by long axis of the hand). There is additional teardrop shaped decreased T1 and increased T2 signal fluid without definite peripheral enhancement extending dorsally from the fourth dorsal extensor compartment tendon sheath at the dorsal medial aspect of the fourth metacarpophalangeal joint (axial series 6, image 21), a possible ganglion. There is moderate edema and swelling within the  subcutaneous fat of the dorsal wrist and hand of the level of the metacarpophalangeal joints, greatest at the level of the distal fourth and second metacarpal shafts. IMPRESSION: 1. Moderate edema and swelling within the subcutaneous fat of the dorsal hand suggesting cellulitis. 2. Walled-off fluid just dorsal to the midcarpal joint with thin peripheral wall enhancement. This may represent a moderate joint effusion with synovial enhancement, concerning for infection within the joint fluid (septic joint). A walled-off  abscess just dorsal to the midcarpal row and deep to the extensor tendons is also possible. 3. Small teardrop-shaped fluid extending dorsally from the fourth digit extensor tendons near the metacarpophalangeal joint, a possible nonspecific ganglion. 4. Mild extensor carpi radialis longus and brevis and mild fourth dorsal extensor compartment tenosynovitis. Cannot exclude the presence of infection within this fluid. 5. Partially healed fracture of the proximal shaft of fifth metacarpal, as seen on prior radiographs. There is edema within the fifth metacarpal shaft suggesting incomplete healing at this time. Electronically Signed   By: Neita Garnet M.D.   On: 08/11/2021 13:15   DG Hand Complete Right  Result Date: 08/10/2021 CLINICAL DATA:  Right hand pain, swelling EXAM: RIGHT HAND - COMPLETE 3+ VIEW COMPARISON:  None Available. FINDINGS: Mild widening of the distal radioulnar joint, possibly posttraumatic in nature. No acute fracture or dislocation. Healing proximal diaphyseal fracture of the right fifth metacarpal identified with mild shortening. Mild volar angulation of the distal fracture fragment. 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 Extensive soft tissue swelling of the right hand. No retained radiopaque foreign body. IMPRESSION: Soft tissue swelling.  No acute fracture or dislocation. Healing right fifth metacarpal fracture demonstrating residual volar  angulation. 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 Electronically Signed   By: Helyn Numbers M.D.   On: 08/10/2021 23:39   [place] DRAIN/INJ INTER JOINT/BURSA  Result Date: 08/11/2021 INDICATION: Concern for abscess of the right hand EXAM: Ultrasound-guided aspiration of right hand fluid collection MEDICATIONS: None. ANESTHESIA/SEDATION: Local analgesia COMPLICATIONS: None immediate. PROCEDURE: Informed written consent was obtained from the patient after a thorough discussion of the procedural risks, benefits and alternatives. All questions were addressed. Maximal Sterile Barrier Technique was utilized including caps, mask, sterile gowns, sterile gloves, sterile drape, hand hygiene and skin antiseptic. A timeout was performed prior to the initiation of the procedure. The patient's hand was placed on the exam table. Focused sonographic exam of the dorsum of the right hand demonstrated a small anechoic fluid collection just deep to the subcutaneous tissues measuring 1.6 x 1.3 x 0.8 cm. Skin entry site was marked, and the overlying skin was prepped and draped in the standard sterile fashion. Local analgesia was obtained with 1% lidocaine. Using ultrasound guidance, aspiration of the fluid collection was performed using a 21 gauge needle. Approximately 1 mL of thick serous fluid was aspirated. The sample was sent to the lab for analysis. A clean dressing was placed after hemostasis. The patient tolerated the procedure without immediate complication. IMPRESSION: Successful ultrasound-guided aspiration of fluid collection in the dorsum of the right hand yielding approximately 1 mL of thick serous fluid. Sample sent for microbiology analysis. Electronically Signed   By: Olive Bass M.D.   On: 08/11/2021 16:26   [place] RT UPPER EXTREM LTD SOFT TISSUE NON VASCULAR  Result Date: 08/11/2021 CLINICAL DATA:  Swelling along the dorsum of the hand. Concern for  abscess. In EXAM: ULTRASOUND RIGHT UPPER EXTREMITY LIMITED TECHNIQUE: Ultrasound examination of the upper extremity soft tissues was performed in the area of clinical concern. COMPARISON:  None Available. FINDINGS: Ultrasound performed along the dorsum of the hand. This demonstrates a small fluid collection deep within the dorsum of the hand measuring 1.3 x 3.3 x 1.0 cm. This is concerning for deep tissue abscess. IMPRESSION: 3.3 x 1.3 x 1.0 cm fluid collection concerning for deep tissue abscess along the dorsum of the hand. Electronically Signed   By: Caryn Bee  Dover M.D.   On: 08/11/2021 01:36            LOS: 2 days       Sunnie Nielsen, DO [facility] 08/13/2021, 1:13 PM   Staff may message me via secure chat in Epic  but this may not receive immediate response,  please page for urgent matters!  If 7PM-7AM, please contact night-coverage [email or web address]  Dictation software was used to generate the above note. Typos may occur and escape review, as with typed/written notes. Please contact Dr Lyn Hollingshead directly for clarity if needed.

## 2021-08-14 ENCOUNTER — Inpatient Hospital Stay: Payer: Self-pay

## 2021-08-14 MED ORDER — GADOBUTROL 1 MMOL/ML IV SOLN
7.0000 mL | Freq: Once | INTRAVENOUS | Status: AC | PRN
  Administered 2021-08-14: 7 mL via INTRAVENOUS

## 2021-08-14 NOTE — Progress Notes (Signed)
PROGRESS NOTE    Douglas Collins  AJO:[id number] DOB: 02-17-1986  DOA: 08/10/2021 Date of Service: 08/14/21 PCP: Oneita Hurt, No     Brief Narrative / Hospital Course:  To ED w/ hand pain late 08/10/21, admitted 08/11/21 for cellulitis/abscess dorsum R hand. Started on IV Rocephin and Vancomycin. Orthopedics consulted. [place] and MRI done. US guided drainage 05/26. Today 08/14/21 will be Day 4 IV abx, no growth on cultures so far, have been reincubated for better growth. Pt reports pain is no better. Swelling is about the same on exam.   Consultants:  Orthopedics Interventional Radiology   Procedures: 08/11/21: IR Ultrasound-guided aspiration of right hand fluid collection    Subjective: Patient reports pain in hand is same, no fever/chills, no nausea. He has been up walking, no lightheadedness, no CP/SOB      ASSESSMENT & PLAN:   Principal Problem:   Abscess of dorsum of hand, right Active Problems:   Hepatitis C   Alcohol use disorder   Tobacco use disorder   History of intravenous drug abuse (HCC)   Abscess of dorsum of hand, right IV Rocephin and vancomycin, today 08/14/21 will be Day 4 IV abx Pain control - Rx as well as elevation and ice S/p US guided I&D 05/26 Await cultures to determine po abx on discharge - reincubated for better growth Will message ortho - appreciate recs re: repeat imaging needed given lack of resolution   Hepatitis C Not currently on treatment  Alcohol use disorder Alcohol withdrawal prevention protocol  Tobacco use disorder Nicotine patch  History of intravenous drug abuse (HCC) Intermittent use.  Counseled on complete abstinence    DVT prophylaxis: early ambulation Code Status: FULL Family Communication: support person is at bedside during rounds  Disposition Plan / TOC needs: home, no TOC needs at this time Barriers to discharge / significant pending items: pending cultures to plan for po abx on discharge hopefully 05/28 or  05/29             Objective: Vitals:   08/13/21 1723 08/13/21 1926 08/14/21 0346 08/14/21 0744  BP: (!) 127/98 131/81 115/69 126/74  Pulse: 83 77 95 82  Resp: 18 17 15 16   Temp:  97.8 F (36.6 C) 98.4 F (36.9 C) 98.1 F (36.7 C)  TempSrc:  Oral    SpO2: 100% 100% 98% 100%  Weight:      Height:       No intake or output data in the 24 hours ending 08/14/21 1316  Filed Weights   08/10/21 2302  Weight: 72.6 kg    Examination:  Constitutional:  VS as above General Appearance: alert, well-developed, well-nourished, mild distress d/t pain Eyes: Normal lids and conjunctive, non-icteric sclera Ears, Nose, Mouth, Throat: Normal appearance Neck: No masses, trachea midline Respiratory: Normal respiratory effort Cardiovascular: No lower extremity edema Musculoskeletal:  Gait normal R hand dorsal swelling, no significant erythema, (+)tenderness, normal wrist ROM Neurological: No cranial nerve deficit on limited exam Motor intact and symmetric Psychiatric: Normal judgment/insight Normal mood and affect       Scheduled Medications:   folic acid  1 mg Oral Daily   LORazepam  0-4 mg Intravenous Q12H   multivitamin with minerals  1 tablet Oral Daily   thiamine  100 mg Oral Daily   Or   thiamine  100 mg Intravenous Daily    Continuous Infusions:  cefTRIAXone (ROCEPHIN)  IV 1 g (08/14/21 0109)   vancomycin 1,500 mg (08/14/21 1306)    PRN Medications:  acetaminophen **OR** acetaminophen, HYDROcodone-acetaminophen, morphine injection, ondansetron **OR** ondansetron (ZOFRAN) IV  Antimicrobials:  Anti-infectives (From admission, onward)    Start     Dose/Rate Route Frequency Ordered Stop   08/12/21 0200  cefTRIAXone (ROCEPHIN) 1 g in sodium chloride 0.9 % 100 mL IVPB        1 g 200 mL/hr over 30 Minutes Intravenous Every 24 hours 08/11/21 0614     08/11/21 1430  vancomycin (VANCOREADY) IVPB 1500 mg/300 mL        1,500 mg 150 mL/hr over 120 Minutes  Intravenous Every 12 hours 08/11/21 0256     08/11/21 0015  vancomycin (VANCOREADY) IVPB 1750 mg/350 mL        1,750 mg 175 mL/hr over 120 Minutes Intravenous  Once 08/11/21 0011 08/11/21 0754   08/11/21 0015  cefTRIAXone (ROCEPHIN) 2 g in sodium chloride 0.9 % 100 mL IVPB        2 g 200 mL/hr over 30 Minutes Intravenous  Once 08/11/21 0011 08/11/21 0233       Data Reviewed: I have personally reviewed following labs and imaging studies  CBC: Recent Labs  Lab 08/11/21 0137 08/12/21 0447 08/13/21 0350  WBC 5.5 4.3 4.7  NEUTROABS 2.6  --   --   HGB 15.4 14.6 14.2  HCT 45.3 43.7 42.6  MCV 89.2 89.0 89.1  PLT 255 220 219   Basic Metabolic Panel: Recent Labs  Lab 08/11/21 0137 08/12/21 0447 08/13/21 0350  NA 135 141 135  K 3.9 3.3* 3.6  CL 99 103 101  CO2 27 26 27   GLUCOSE 96 105* 100*  BUN 6 7 9   CREATININE 0.54* 0.51* 0.53*  CALCIUM 9.0 8.7* 8.7*   GFR: Estimated Creatinine Clearance: 129.8 mL/min (A) (by C-G formula based on SCr of 0.53 mg/dL (L)). Liver Function Tests: Recent Labs  Lab 08/11/21 0137  AST 140*  ALT 72*  ALKPHOS 60  BILITOT 0.8  PROT 8.1  ALBUMIN 4.0   No results for input(s): LIPASE, AMYLASE in the last 168 hours. No results for input(s): AMMONIA in the last 168 hours. Coagulation Profile: Recent Labs  Lab 08/11/21 0137  INR 1.0   Cardiac Enzymes: No results for input(s): CKTOTAL, CKMB, CKMBINDEX, TROPONINI in the last 168 hours. BNP (last 3 results) No results for input(s): PROBNP in the last 8760 hours. HbA1C: No results for input(s): HGBA1C in the last 72 hours. CBG: No results for input(s): GLUCAP in the last 168 hours. Lipid Profile: No results for input(s): CHOL, HDL, LDLCALC, TRIG, CHOLHDL, LDLDIRECT in the last 72 hours. Thyroid Function Tests: No results for input(s): TSH, T4TOTAL, FREET4, T3FREE, THYROIDAB in the last 72 hours. Anemia Panel: No results for input(s): VITAMINB12, FOLATE, FERRITIN, TIBC, IRON, RETICCTPCT  in the last 72 hours. Urine analysis:    Component Value Date/Time   COLORURINE COLORLESS (A) 08/11/2021 0011   APPEARANCEUR CLEAR (A) 08/11/2021 0011   LABSPEC 1.002 (L) 08/11/2021 0011   PHURINE 6.0 08/11/2021 0011   GLUCOSEU NEGATIVE 08/11/2021 0011   HGBUR NEGATIVE 08/11/2021 0011   BILIRUBINUR NEGATIVE 08/11/2021 0011   KETONESUR NEGATIVE 08/11/2021 0011   PROTEINUR NEGATIVE 08/11/2021 0011   NITRITE NEGATIVE 08/11/2021 0011   LEUKOCYTESUR NEGATIVE 08/11/2021 0011   Sepsis Labs: @LABRCNTIP (procalcitonin:4,lacticidven:4)  Recent Results (from the past 240 hour(s))  Blood culture (routine single)     Status: None (Preliminary result)   Collection Time: 08/11/21  1:37 AM   Specimen: BLOOD  Result Value Ref Range Status  Specimen Description BLOOD RIGHT UPPER ARM  Final   Special Requests   Final    BOTTLES DRAWN AEROBIC AND ANAEROBIC Blood Culture adequate volume   Culture   Final    NO GROWTH 3 DAYS Performed at [facility], [address] [place], [place] [address]    Report Status PENDING  Incomplete  Aerobic/Anaerobic Culture w Gram Stain (surgical/deep wound)     Status: None (Preliminary result)   Collection Time: 08/11/21  4:15 PM   Specimen: Abscess  Result Value Ref Range Status   Specimen Description ABSCESS  Final   Special Requests RIGHT HAND  Final   Gram Stain   Final    ABUNDANT WBC PRESENT, PREDOMINANTLY PMN NO ORGANISMS SEEN    Culture   Final    CULTURE REINCUBATED FOR BETTER GROWTH Performed at [facility], [address] [place], [place] [address]    Report Status PENDING  Incomplete         Radiology Studies last 96 hours: MR HAND RIGHT W WO CONTRAST  Result Date: 08/11/2021 CLINICAL DATA:  Soft tissue mass of right hand. Right dorsal hand pain and swelling over the past couple of days. No trauma. Remote right hand fracture months to a year ago. EXAM: MRI OF THE RIGHT HAND WITHOUT AND WITH CONTRAST TECHNIQUE:  Multiplanar, multisequence MR imaging of the right hand was performed before and after the administration of intravenous contrast. CONTRAST:  41mL GADAVIST GADOBUTROL 1 MMOL/ML IV SOLN COMPARISON:  Right hand radiographs 08/10/2021 FINDINGS: Bones/Joint/Cartilage There is cortical thickening from healing of a fracture of the proximal diaphysis of fifth metacarpal seen on prior radiographs. There is mild associated marrow edema within the metacarpal shaft suggesting incomplete healing at this time. There is associated foreshortening of the fifth metacarpal shaft. There are moderate degenerative osteophytes at the dorsal aspect of the fifth carpometacarpal joint, likely posttraumatic in etiology. Ligaments The metacarpophalangeal and interphalangeal medial and lateral collateral ligaments appear intact. Muscles and Tendons Mild extensor carpi radialis longus and brevis and mild fourth dorsal extensor compartment tenosynovitis. Soft tissues Deep to the extensor carpi radialis brevis tendon in the fourth dorsal extensor tendon compartment at the level of the midcarpal joint, there is decreased T1 increased T2 signal fluid with mild peripheral border enhancement (axial images 4-7) and irregular borders. This corresponds to the fluid seen on ultrasound. This may represent a moderate midcarpal joint effusion with peripheral enhancement suspicious for synovitis and possible joint infection. This also could represent an abscess within the soft tissues just dorsal to the midcarpal joint. Abscess just dorsal to the midcarpal row. This measures up to approximately 3.6 by 0.9 by 1.1 cm (transverse by short axis of the hand by long axis of the hand). There is additional teardrop shaped decreased T1 and increased T2 signal fluid without definite peripheral enhancement extending dorsally from the fourth dorsal extensor compartment tendon sheath at the dorsal medial aspect of the fourth metacarpophalangeal joint (axial series 6,  image 21), a possible ganglion. There is moderate edema and swelling within the subcutaneous fat of the dorsal wrist and hand of the level of the metacarpophalangeal joints, greatest at the level of the distal fourth and second metacarpal shafts. IMPRESSION: 1. Moderate edema and swelling within the subcutaneous fat of the dorsal hand suggesting cellulitis. 2. Walled-off fluid just dorsal to the midcarpal joint with thin peripheral wall enhancement. This may represent a moderate joint effusion with synovial enhancement, concerning for infection within the joint fluid (  septic joint). A walled-off abscess just dorsal to the midcarpal row and deep to the extensor tendons is also possible. 3. Small teardrop-shaped fluid extending dorsally from the fourth digit extensor tendons near the metacarpophalangeal joint, a possible nonspecific ganglion. 4. Mild extensor carpi radialis longus and brevis and mild fourth dorsal extensor compartment tenosynovitis. Cannot exclude the presence of infection within this fluid. 5. Partially healed fracture of the proximal shaft of fifth metacarpal, as seen on prior radiographs. There is edema within the fifth metacarpal shaft suggesting incomplete healing at this time. Electronically Signed   By: Neita Garnet M.D.   On: 08/11/2021 13:15   DG Hand Complete Right  Result Date: 08/10/2021 CLINICAL DATA:  Right hand pain, swelling EXAM: RIGHT HAND - COMPLETE 3+ VIEW COMPARISON:  None Available. FINDINGS: Mild widening of the distal radioulnar joint, possibly posttraumatic in nature. No acute fracture or dislocation. Healing proximal diaphyseal fracture of the right fifth metacarpal identified with mild shortening. Mild volar angulation of the distal fracture fragment. 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 Extensive soft tissue swelling of the right hand. No retained radiopaque foreign body. IMPRESSION: Soft tissue swelling.  No acute fracture or  dislocation. Healing right fifth metacarpal fracture demonstrating residual volar angulation. 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 Electronically Signed   By: Helyn Numbers M.D.   On: 08/10/2021 23:39   [place] DRAIN/INJ INTER JOINT/BURSA  Result Date: 08/11/2021 INDICATION: Concern for abscess of the right hand EXAM: Ultrasound-guided aspiration of right hand fluid collection MEDICATIONS: None. ANESTHESIA/SEDATION: Local analgesia COMPLICATIONS: None immediate. PROCEDURE: Informed written consent was obtained from the patient after a thorough discussion of the procedural risks, benefits and alternatives. All questions were addressed. Maximal Sterile Barrier Technique was utilized including caps, mask, sterile gowns, sterile gloves, sterile drape, hand hygiene and skin antiseptic. A timeout was performed prior to the initiation of the procedure. The patient's hand was placed on the exam table. Focused sonographic exam of the dorsum of the right hand demonstrated a small anechoic fluid collection just deep to the subcutaneous tissues measuring 1.6 x 1.3 x 0.8 cm. Skin entry site was marked, and the overlying skin was prepped and draped in the standard sterile fashion. Local analgesia was obtained with 1% lidocaine. Using ultrasound guidance, aspiration of the fluid collection was performed using a 21 gauge needle. Approximately 1 mL of thick serous fluid was aspirated. The sample was sent to the lab for analysis. A clean dressing was placed after hemostasis. The patient tolerated the procedure without immediate complication. IMPRESSION: Successful ultrasound-guided aspiration of fluid collection in the dorsum of the right hand yielding approximately 1 mL of thick serous fluid. Sample sent for microbiology analysis. Electronically Signed   By: Olive Bass M.D.   On: 08/11/2021 16:26   [place] RT UPPER EXTREM LTD SOFT TISSUE NON VASCULAR  Result Date:  08/11/2021 CLINICAL DATA:  Swelling along the dorsum of the hand. Concern for abscess. In EXAM: ULTRASOUND RIGHT UPPER EXTREMITY LIMITED TECHNIQUE: Ultrasound examination of the upper extremity soft tissues was performed in the area of clinical concern. COMPARISON:  None Available. FINDINGS: Ultrasound performed along the dorsum of the hand. This demonstrates a small fluid collection deep within the dorsum of the hand measuring 1.3 x 3.3 x 1.0 cm. This is concerning for deep tissue abscess. IMPRESSION: 3.3 x 1.3 x 1.0 cm fluid collection concerning for deep tissue abscess along the dorsum of the hand. Electronically Signed  By: Charlett Nose M.D.   On: 08/11/2021 01:36            LOS: 3 days       Sunnie Nielsen, DO [facility] 08/14/2021, 1:16 PM   Staff may message me via secure chat in Epic  but this may not receive immediate response,  please page for urgent matters!  If 7PM-7AM, please contact night-coverage [email or web address]  Dictation software was used to generate the above note. Typos may occur and escape review, as with typed/written notes. Please contact Dr Lyn Hollingshead directly for clarity if needed.

## 2021-08-14 NOTE — Plan of Care (Signed)
  Problem: Clinical Measurements: Goal: Ability to avoid or minimize complications of infection will improve Outcome: Progressing   

## 2021-08-15 ENCOUNTER — Other Ambulatory Visit: Payer: Self-pay

## 2021-08-15 ENCOUNTER — Encounter
Admission: EM | Disposition: A | Discharge status: Home / Self Care | Payer: Self-pay | Source: Home / Self Care | Attending: Internal Medicine

## 2021-08-15 ENCOUNTER — Inpatient Hospital Stay: Payer: Self-pay | Admitting: Anesthesiology

## 2021-08-15 ENCOUNTER — Inpatient Hospital Stay: Payer: Self-pay

## 2021-08-15 ENCOUNTER — Encounter: Payer: Self-pay | Admitting: Internal Medicine

## 2021-08-15 HISTORY — PX: INCISION AND DRAINAGE: SHX5863

## 2021-08-15 LAB — URINE DRUG SCREEN, QUALITATIVE (ARMC ONLY)
Amphetamines, Ur Screen: POSITIVE — AB
Barbiturates, Ur Screen: NOT DETECTED
Benzodiazepine, Ur Scrn: NOT DETECTED
Cannabinoid 50 Ng, Ur ~~LOC~~: NOT DETECTED
Cocaine Metabolite,Ur ~~LOC~~: POSITIVE — AB
MDMA (Ecstasy)Ur Screen: NOT DETECTED
Methadone Scn, Ur: NOT DETECTED
Opiate, Ur Screen: POSITIVE — AB
Phencyclidine (PCP) Ur S: NOT DETECTED
Tricyclic, Ur Screen: NOT DETECTED

## 2021-08-15 LAB — CBC
HCT: 47.2 % (ref 39.0–52.0)
Hemoglobin: 15.2 g/dL (ref 13.0–17.0)
MCH: 29.3 pg (ref 26.0–34.0)
MCHC: 32.2 g/dL (ref 30.0–36.0)
MCV: 91.1 fL (ref 80.0–100.0)
Platelets: 252 10*3/uL (ref 150–400)
RBC: 5.18 MIL/uL (ref 4.22–5.81)
RDW: 14.4 % (ref 11.5–15.5)
WBC: 4.7 10*3/uL (ref 4.0–10.5)
nRBC: 0 % (ref 0.0–0.2)

## 2021-08-15 LAB — CREATININE, SERUM
Creatinine, Ser: 0.41 mg/dL — ABNORMAL LOW (ref 0.61–1.24)
GFR, Estimated: >60 mL/min (ref >60)

## 2021-08-15 SURGERY — INCISION AND DRAINAGE
Anesthesia: General | Laterality: Right

## 2021-08-15 MED ORDER — DROPERIDOL 2.5 MG/ML IJ SOLN: 0.6250 mg | Freq: Once | INTRAMUSCULAR | Status: DC | PRN

## 2021-08-15 MED ORDER — HYDROMORPHONE HCL 1 MG/ML IJ SOLN
0.2500 mg | INTRAMUSCULAR | Status: DC | PRN
  Administered 2021-08-15 (×2): 0.5 mg via INTRAVENOUS

## 2021-08-15 MED ORDER — HYDROMORPHONE HCL 1 MG/ML IJ SOLN
INTRAMUSCULAR | Status: AC
  Filled 2021-08-15: qty 1

## 2021-08-15 MED ORDER — HYDROMORPHONE HCL 1 MG/ML IJ SOLN
INTRAMUSCULAR | Status: AC
  Administered 2021-08-15: 0.5 mg via INTRAVENOUS
  Filled 2021-08-15: qty 1

## 2021-08-15 MED ORDER — SODIUM CHLORIDE 0.9 % IV SOLN
INTRAVENOUS | Status: DC | PRN
  Administered 2021-08-15: 80 mg

## 2021-08-15 MED ORDER — LIDOCAINE HCL (CARDIAC) PF 100 MG/5ML IV SOSY
PREFILLED_SYRINGE | INTRAVENOUS | Status: DC | PRN
  Administered 2021-08-15: 100 mg via INTRAVENOUS

## 2021-08-15 MED ORDER — FENTANYL CITRATE (PF) 100 MCG/2ML IJ SOLN
INTRAMUSCULAR | Status: DC | PRN
  Administered 2021-08-15: 50 ug via INTRAVENOUS

## 2021-08-15 MED ORDER — MIDAZOLAM HCL 2 MG/2ML IJ SOLN
INTRAMUSCULAR | Status: DC | PRN
  Administered 2021-08-15: 2 mg via INTRAVENOUS

## 2021-08-15 MED ORDER — PROPOFOL 10 MG/ML IV BOLUS
INTRAVENOUS | Status: DC | PRN
  Administered 2021-08-15: 250 mg via INTRAVENOUS

## 2021-08-15 MED ORDER — MIDAZOLAM HCL 2 MG/2ML IJ SOLN
INTRAMUSCULAR | Status: AC
  Filled 2021-08-15: qty 2

## 2021-08-15 MED ORDER — ACETAMINOPHEN 10 MG/ML IV SOLN: 1000.0000 mg | Freq: Once | INTRAVENOUS | Status: DC | PRN

## 2021-08-15 MED ORDER — PROPOFOL 10 MG/ML IV BOLUS
INTRAVENOUS | Status: AC
  Filled 2021-08-15: qty 20

## 2021-08-15 MED ORDER — PROMETHAZINE HCL 25 MG/ML IJ SOLN: 6.2500 mg | INTRAMUSCULAR | Status: DC | PRN

## 2021-08-15 MED ORDER — DEXAMETHASONE SODIUM PHOSPHATE 10 MG/ML IJ SOLN
INTRAMUSCULAR | Status: DC | PRN
  Administered 2021-08-15: 8 mg via INTRAVENOUS

## 2021-08-15 MED ORDER — LACTATED RINGERS IV SOLN: INTRAVENOUS | Status: DC | PRN

## 2021-08-15 MED ORDER — 0.9 % SODIUM CHLORIDE (POUR BTL) OPTIME
TOPICAL | Status: DC | PRN
  Administered 2021-08-15: 1000 mL

## 2021-08-15 MED ORDER — FENTANYL CITRATE (PF) 100 MCG/2ML IJ SOLN
INTRAMUSCULAR | Status: AC
  Filled 2021-08-15: qty 2

## 2021-08-15 MED ORDER — ONDANSETRON HCL 4 MG/2ML IJ SOLN
INTRAMUSCULAR | Status: DC | PRN
  Administered 2021-08-15: 4 mg via INTRAVENOUS

## 2021-08-15 SURGICAL SUPPLY — 33 items
BNDG COHESIVE 4X5 TAN ST LF (GAUZE/BANDAGES/DRESSINGS) ×1 IMPLANT
BNDG ELASTIC 4X5.8 VLCR NS LF (GAUZE/BANDAGES/DRESSINGS) ×1 IMPLANT
BNDG GAUZE ELAST 4 BULKY (GAUZE/BANDAGES/DRESSINGS) ×1 IMPLANT
CHLORAPREP W/TINT 26 (MISCELLANEOUS) ×3 IMPLANT
DRAIN PENROSE 12X.25 LTX STRL (MISCELLANEOUS) ×1 IMPLANT
DRAPE 3/4 80X56 (DRAPES) IMPLANT
DRAPE FLUOR MINI C-ARM 54X84 (DRAPES) ×1 IMPLANT
DRAPE INCISE IOBAN 66X60 STRL (DRAPES) ×1 IMPLANT
DRAPE SURG 17X11 SM STRL (DRAPES) IMPLANT
DRAPE U-SHAPE 47X51 STRL (DRAPES) IMPLANT
ELECT REM PT RETURN 9FT ADLT (ELECTROSURGICAL) ×2
ELECTRODE REM PT RTRN 9FT ADLT (ELECTROSURGICAL) ×1 IMPLANT
GAUZE SPONGE 4X4 12PLY STRL (GAUZE/BANDAGES/DRESSINGS) ×1 IMPLANT
GAUZE XEROFORM 1X8 LF (GAUZE/BANDAGES/DRESSINGS) ×1 IMPLANT
GLOVE BIO SURGEON STRL SZ7.5 (GLOVE) ×2 IMPLANT
GLOVE SURG UNDER POLY LF SZ7.5 (GLOVE) ×2 IMPLANT
GOWN STRL REUS W/ TWL XL LVL3 (GOWN DISPOSABLE) ×2 IMPLANT
GOWN STRL REUS W/TWL XL LVL3 (GOWN DISPOSABLE) ×2
IV NS IRRIG 3000ML ARTHROMATIC (IV SOLUTION) ×2 IMPLANT
KIT PREVENA INCISION MGT 13 (CANNISTER) IMPLANT
KIT TURNOVER KIT A (KITS) ×2 IMPLANT
MANIFOLD NEPTUNE II (INSTRUMENTS) ×2 IMPLANT
NS IRRIG 1000ML POUR BTL (IV SOLUTION) ×2 IMPLANT
PACK EXTREMITY ARMC (MISCELLANEOUS) ×2 IMPLANT
PAD CAST CTTN 4X4 STRL (SOFTGOODS) ×1 IMPLANT
PADDING CAST COTTON 4X4 STRL (SOFTGOODS) ×1
SET CYSTO W/LG BORE CLAMP LF (SET/KITS/TRAYS/PACK) ×1 IMPLANT
STAPLER SKIN PROX 35W (STAPLE) ×2 IMPLANT
STOCKINETTE IMPERVIOUS 9X36 MD (GAUZE/BANDAGES/DRESSINGS) ×2 IMPLANT
SUT ETHILON 2 0 FS 18 (SUTURE) ×1 IMPLANT
SWAB CULTURE AMIES ANAERIB BLU (MISCELLANEOUS) ×1 IMPLANT
TOWEL OR 17X26 4PK STRL BLUE (TOWEL DISPOSABLE) ×1 IMPLANT
WATER STERILE IRR 500ML POUR (IV SOLUTION) ×2 IMPLANT

## 2021-08-15 NOTE — Anesthesia Preprocedure Evaluation (Addendum)
Anesthesia Evaluation  Patient identified by MRN, date of birth, ID band Patient awake    Reviewed: Allergy & Precautions, NPO status , Patient's Chart, lab work & pertinent test results  Airway Mallampati: III  TM Distance: >3 FB Neck ROM: full    Dental no notable dental hx.    Pulmonary Current Smoker,    Pulmonary exam normal        Cardiovascular negative cardio ROS Normal cardiovascular exam     Neuro/Psych negative neurological ROS  negative psych ROS   GI/Hepatic negative GI ROS, (+)     substance abuse  alcohol use and cocaine use, Hepatitis -, C  Endo/Other  negative endocrine ROS  Renal/GU      Musculoskeletal   Abdominal Normal abdominal exam  (+)   Peds  Hematology negative hematology ROS (+)   Anesthesia Other Findings Abscess of dorsum of hand, right History of intravenous drug abuse Cocaine use- positive test 5/26 and 5/30, today. Surgeon will make case an emergency and not wait for a negative test. Vitals and Physical exam are non-toxic appearing. Pt denies use during hospitalization.  Past Surgical History: 1989: TONSILLECTOMY; Bilateral     Comment:  age 36 per pt  BMI    Body Mass Index: 22.32 kg/m      Reproductive/Obstetrics negative OB ROS                            Anesthesia Physical Anesthesia Plan  ASA: 3  Anesthesia Plan: General LMA   Post-op Pain Management: Tylenol PO (pre-op)*, Toradol IV (intra-op)* and Ketamine IV*   Induction: Intravenous  PONV Risk Score and Plan: Dexamethasone, Ondansetron, Midazolam and Treatment may vary due to age or medical condition  Airway Management Planned: LMA  Additional Equipment:   Intra-op Plan:   Post-operative Plan: Extubation in OR  Informed Consent: I have reviewed the patients History and Physical, chart, labs and discussed the procedure including the risks, benefits and alternatives for the  proposed anesthesia with the patient or authorized representative who has indicated his/her understanding and acceptance.     Dental Advisory Given  Plan Discussed with: Anesthesiologist, CRNA and Surgeon  Anesthesia Plan Comments:        Anesthesia Quick Evaluation

## 2021-08-15 NOTE — Discharge Instructions (Signed)
Orthopedic discharge instructions:  Continue hand elevation.  Okay to for dressing changes every other day with gauze and Ace wrap. Take antibiotics as prescribed. Patient will follow up with Jennet Maduro, PA-C at [facility] in 7-10 days (6/6 or 6/8) for wound check and drain removal.

## 2021-08-15 NOTE — Anesthesia Procedure Notes (Signed)
Procedure Name: LMA Insertion Date/Time: 08/15/2021 5:34 PM Performed by: Philbert Riser, CRNA Pre-anesthesia Checklist: Patient identified, Patient being monitored, Timeout performed, Emergency Drugs available and Suction available Patient Re-evaluated:Patient Re-evaluated prior to induction Oxygen Delivery Method: Circle system utilized Preoxygenation: Pre-oxygenation with 100% oxygen Induction Type: IV induction Ventilation: Mask ventilation without difficulty LMA: LMA inserted LMA Size: 4.5 Tube type: Oral Number of attempts: 1 Placement Confirmation: positive ETCO2 and breath sounds checked- equal and bilateral Tube secured with: Tape Dental Injury: Teeth and Oropharynx as per pre-operative assessment

## 2021-08-15 NOTE — Progress Notes (Signed)
The patient has been re-examined, and the chart reviewed.  The patient's right dorsal hand/wrist pain has worsened over the last 24 to 48 hours and MRI shows concern for potential midcarpal septic arthritis versus abscess.  Due to worsening symptoms, we will plan for open incision and drainage procedure.  The risks, benefits, and alternatives have been discussed at length, and the patient is willing to proceed.     Ross Marcus

## 2021-08-15 NOTE — Op Note (Signed)
DATE OF SURGERY:  08/15/2021  TIME: 6:31 PM  PATIENT NAME:  Douglas Collins  AGE: 36 y.o.  PRE-OPERATIVE DIAGNOSIS:  Right dorsal hand infection  POST-OPERATIVE DIAGNOSIS:  SAME  PROCEDURE: Right dorsal midcarpal wrist INCISION AND DRAINAGE  SURGEON:  Renee Harder  EBL:  10 cc  COMPLICATIONS: None  Operative Findings: small sero-sanguinous fluid collection dorsal to the capitate, carpal bones without softening or obvious evidence of osteomyelitis   PREOPERATIVE INDICATIONS:  [name] is a 36 y.o. year old male who was admitted on 5/26 with concern for right dorsal hand/wrist infection.  He underwent IR aspiration of dorsal wrist fluid collection and subsequent IV antibiotics.  Due to worsening symptoms, repeat MRI was obtained which showed concern for midcarpal effusion indicative of septic arthritis.  We discussed further treatment options recommendation was made for incision and drainage procedure.  Risks were discussed in detail and the patient provided informed consent.  OPERATIVE PROCEDURE:  The patient was brought to the operating room and placed in the supine position.  General anesthesia was administered.  Right upper extremity then prepped and draped in normal standard sterile fashion.  Preoperative timeout was taken.  2 cm incision was made just distal to the radiocarpal joint between the third and fourth extensor compartment in line with location of fluid collection seen on MRI.  Dissection was carried down through subcutaneous tissue.  Extensor tendons were identified and retracted.  Midcarpal joint was identified and incised sharply.  This was checked with mini C-arm fluoroscopy to ensure location was the same as the fluid collection seen on MRI. 1 to 2 cc of serosanguineous fluid was expressed from the joint.  This was cultured.  1 L antibiotic irrigation was then utilized for irrigation.  Penrose drain was placed.  4-0 Chromic Gut suture was  utilized to close the skin.  Sterile dressing was applied. The patient was awakened and returned to PACU in stable and satisfactory condition. There no complications and the patient tolerated the procedure well.   POSTOPERATIVE PLAN: Continue antibiotic regimen per hospitalist team with tailoring to p.o. antibiotics based on culture results. Okay for dressing changes every other day.  Keep Penrose drain in place until follow-up in 1 week. Follow-up with Tessa Lerner, PA at [facility] on 6/6 or 6/8.   Renee Harder

## 2021-08-15 NOTE — Progress Notes (Signed)
PROGRESS NOTE    Douglas Collins  ZOX:[id number] DOB: 04/27/1985  DOA: 08/10/2021 Date of Service: 08/15/21 PCP: Oneita HurtPcp, No     Brief Narrative / Hospital Course:  To ED w/ hand pain late 08/10/21, admitted 08/11/21 for cellulitis/abscess dorsum R hand. Started on IV Rocephin and Vancomycin. Orthopedics consulted. US and MRI done. US guided drainage 05/26. 08/14/21 Day 4 IV abx, no growth on cultures so far, reincubated for better growth. Pt reported pain is no better. Swelling about the same on exam. MRI repeated. Ortho to reevaluate for debridement.   Consultants:  Orthopedics Interventional Radiology   Procedures: 08/11/21: IR Ultrasound-guided aspiration of right hand fluid collection [PLANNED: ORTHO 08/15/21 incision and drainage R hand/wrist]    Subjective: Patient reports pain in hand is same, no fever/chills, no nausea. He has been up walking, no lightheadedness, no CP/SOB NO change from yesterday in the hand still painful and swollen     ASSESSMENT & PLAN:   Principal Problem:   Abscess of dorsum of hand, right Active Problems:   Hepatitis C   Alcohol use disorder   Tobacco use disorder   History of intravenous drug abuse (HCC)   Abscess of dorsum of hand, right IV Rocephin and vancomycin, today 08/14/21 will be Day 4 IV abx Pain control - Rx as well as elevation and ice S/p US guided I&D 05/26 Await cultures to determine po abx on discharge - reincubated for better growth Ortho to take to OR 08/15/21 for I&D  Hepatitis C Not currently on treatment  Alcohol use disorder Alcohol withdrawal prevention protocol  Tobacco use disorder Nicotine patch  History of intravenous drug abuse (HCC) Intermittent use.  Counseled on complete abstinence    DVT prophylaxis: early ambulation Code Status: FULL Family Communication: support person is at bedside during rounds  Disposition Plan / TOC needs: home, no TOC needs at this time Barriers to discharge /  significant pending items: pending cultures from surgery              Objective: Vitals:   08/14/21 1543 08/14/21 2010 08/15/21 0501 08/15/21 1456  BP: (!) 142/94 116/77 128/71 133/83  Pulse: 73 96 97 87  Resp: 16 20 20 16   Temp: 97.7 F (36.5 C) 98.9 F (37.2 C) 98.2 F (36.8 C) 98.5 F (36.9 C)  TempSrc:      SpO2: 100% 99% 97% 100%  Weight:      Height:        Intake/Output Summary (Last 24 hours) at 08/15/2021 1602 Last data filed at 08/15/2021 0500 Gross per 24 hour  Intake 540 ml  Output 400 ml  Net 140 ml    Filed Weights   08/10/21 2302  Weight: 72.6 kg    Examination:  Constitutional:  VS as above General Appearance: alert, well-developed, well-nourished, mild distress d/t pain Eyes: Normal lids and conjunctive, non-icteric sclera Ears, Nose, Mouth, Throat: Normal appearance Neck: No masses, trachea midline Respiratory: Normal respiratory effort Cardiovascular: No lower extremity edema Musculoskeletal:  Gait normal R hand dorsal swelling, no significant erythema, (+)tenderness, normal wrist ROM Neurological: No cranial nerve deficit on limited exam Motor intact and symmetric Psychiatric: Normal judgment/insight Normal mood and affect       Scheduled Medications:   folic acid  1 mg Oral Daily   multivitamin with minerals  1 tablet Oral Daily   thiamine  100 mg Oral Daily   Or   thiamine  100 mg Intravenous Daily    Continuous Infusions:  cefTRIAXone (ROCEPHIN)  IV Stopped (08/15/21 0147)   vancomycin 1,500 mg (08/15/21 1349)    PRN Medications:  acetaminophen **OR** acetaminophen, HYDROcodone-acetaminophen, morphine injection, ondansetron **OR** ondansetron (ZOFRAN) IV  Antimicrobials:  Anti-infectives (From admission, onward)    Start     Dose/Rate Route Frequency Ordered Stop   08/12/21 0200  cefTRIAXone (ROCEPHIN) 1 g in sodium chloride 0.9 % 100 mL IVPB        1 g 200 mL/hr over 30 Minutes Intravenous Every 24  hours 08/11/21 0614     08/11/21 1430  vancomycin (VANCOREADY) IVPB 1500 mg/300 mL        1,500 mg 150 mL/hr over 120 Minutes Intravenous Every 12 hours 08/11/21 0256     08/11/21 0015  vancomycin (VANCOREADY) IVPB 1750 mg/350 mL        1,750 mg 175 mL/hr over 120 Minutes Intravenous  Once 08/11/21 0011 08/11/21 0754   08/11/21 0015  cefTRIAXone (ROCEPHIN) 2 g in sodium chloride 0.9 % 100 mL IVPB        2 g 200 mL/hr over 30 Minutes Intravenous  Once 08/11/21 0011 08/11/21 0233       Data Reviewed: I have personally reviewed following labs and imaging studies  CBC: Recent Labs  Lab 08/11/21 0137 08/12/21 0447 08/13/21 0350 08/15/21 0330  WBC 5.5 4.3 4.7 4.7  NEUTROABS 2.6  --   --   --   HGB 15.4 14.6 14.2 15.2  HCT 45.3 43.7 42.6 47.2  MCV 89.2 89.0 89.1 91.1  PLT 255 220 219 252   Basic Metabolic Panel: Recent Labs  Lab 08/11/21 0137 08/12/21 0447 08/13/21 0350 08/15/21 0330  NA 135 141 135  --   K 3.9 3.3* 3.6  --   CL 99 103 101  --   CO2 27 26 27   --   GLUCOSE 96 105* 100*  --   BUN 6 7 9   --   CREATININE 0.54* 0.51* 0.53* 0.41*  CALCIUM 9.0 8.7* 8.7*  --    GFR: Estimated Creatinine Clearance: 129.8 mL/min (A) (by C-G formula based on SCr of 0.41 mg/dL (L)). Liver Function Tests: Recent Labs  Lab 08/11/21 0137  AST 140*  ALT 72*  ALKPHOS 60  BILITOT 0.8  PROT 8.1  ALBUMIN 4.0   No results for input(s): LIPASE, AMYLASE in the last 168 hours. No results for input(s): AMMONIA in the last 168 hours. Coagulation Profile: Recent Labs  Lab 08/11/21 0137  INR 1.0   Cardiac Enzymes: No results for input(s): CKTOTAL, CKMB, CKMBINDEX, TROPONINI in the last 168 hours. BNP (last 3 results) No results for input(s): PROBNP in the last 8760 hours. HbA1C: No results for input(s): HGBA1C in the last 72 hours. CBG: No results for input(s): GLUCAP in the last 168 hours. Lipid Profile: No results for input(s): CHOL, HDL, LDLCALC, TRIG, CHOLHDL, LDLDIRECT  in the last 72 hours. Thyroid Function Tests: No results for input(s): TSH, T4TOTAL, FREET4, T3FREE, THYROIDAB in the last 72 hours. Anemia Panel: No results for input(s): VITAMINB12, FOLATE, FERRITIN, TIBC, IRON, RETICCTPCT in the last 72 hours. Urine analysis:    Component Value Date/Time   COLORURINE COLORLESS (A) 08/11/2021 0011   APPEARANCEUR CLEAR (A) 08/11/2021 0011   LABSPEC 1.002 (L) 08/11/2021 0011   PHURINE 6.0 08/11/2021 0011   GLUCOSEU NEGATIVE 08/11/2021 0011   HGBUR NEGATIVE 08/11/2021 0011   BILIRUBINUR NEGATIVE 08/11/2021 0011   KETONESUR NEGATIVE 08/11/2021 0011   PROTEINUR NEGATIVE 08/11/2021 0011   NITRITE NEGATIVE  08/11/2021 0011   LEUKOCYTESUR NEGATIVE 08/11/2021 0011   Sepsis Labs: (procalcitonin:4,lacticidven:4)  Recent Results (from the past 240 hour(s))  Blood culture (routine single)     Status: None (Preliminary result)   Collection Time: 08/11/21  1:37 AM   Specimen: BLOOD  Result Value Ref Range Status   Specimen Description BLOOD RIGHT UPPER ARM  Final   Special Requests   Final    BOTTLES DRAWN AEROBIC AND ANAEROBIC Blood Culture adequate volume   Culture   Final    NO GROWTH 4 DAYS Performed at [facility], [address] [place], [place] [address]    Report Status PENDING  Incomplete  Aerobic/Anaerobic Culture w Gram Stain (surgical/deep wound)     Status: None (Preliminary result)   Collection Time: 08/11/21  4:15 PM   Specimen: Abscess  Result Value Ref Range Status   Specimen Description ABSCESS  Final   Special Requests RIGHT HAND  Final   Gram Stain   Final    ABUNDANT WBC PRESENT, PREDOMINANTLY PMN NO ORGANISMS SEEN    Culture   Final    RARE STAPHYLOCOCCUS AUREUS SUSCEPTIBILITIES TO FOLLOW Performed at [facility], [address] [place], [place] [address]    Report Status PENDING  Incomplete         Radiology Studies last 96 hours: MR HAND RIGHT W WO CONTRAST  Result Date:  08/14/2021 CLINICAL DATA:  Right hand abscess.  Recent aspiration EXAM: MRI OF THE RIGHT HAND WITHOUT AND WITH CONTRAST TECHNIQUE: Multiplanar, multisequence MR imaging of the right hand was performed before and after the administration of intravenous contrast. CONTRAST:  7mL GADAVIST GADOBUTROL 1 MMOL/ML IV SOLN COMPARISON:  MRI 08/11/2021 FINDINGS: Bones/Joint/Cartilage Progressive patchy bone marrow edema and enhancement throughout the carpal bones and bases of the second through fifth metacarpals. Patchy areas of intermediate T1 marrow signal throughout this region. Moderate sized midcarpal joint effusion with enhancing synovitis. Small radiocarpal joint effusion. There also small effusions of the first, third, fourth, and fifth CMC joints. Healing fracture of the fifth metacarpal diaphysis, similar to prior. Ligaments No acute ligamentous injury identified. Muscles and Tendons Trace second and fourth extensor compartment tenosynovial fluid, unchanged. Mild intramuscular edema of the hand and wrist. No intramuscular fluid collection. Soft tissues Similar size and appearance of fluid collection at the dorsal aspect of the wrist measuring approximately 3.8 x 0.7 x 0.6 cm with peripheral enhancement. This is favored to represent a midcarpal joint effusion. Dorsal subcutaneous edema. No definite extra-articular fluid collections. IMPRESSION: 1. Progressive patchy bone marrow edema and enhancement throughout the carpal bones and bases of the second through fifth metacarpals. Moderate sized midcarpal joint effusion with enhancing synovitis. Findings are concerning for septic arthritis and osteomyelitis. 2. Similar size and appearance of fluid collection at the dorsal aspect of the wrist measuring approximately 3.8 x 0.7 x 0.6 cm with peripheral enhancement. This is favored to represent a midcarpal joint effusion/septic arthritis rather than extra-articular abscess. 3. Trace second and fourth extensor compartment  tenosynovial fluid, also concerning for septic arthritis. Electronically Signed   By: Duanne Guess D.O.   On: 08/14/2021 20:09   [place] DRAIN/INJ INTER JOINT/BURSA  Result Date: 08/11/2021 INDICATION: Concern for abscess of the right hand EXAM: Ultrasound-guided aspiration of right hand fluid collection MEDICATIONS: None. ANESTHESIA/SEDATION: Local analgesia COMPLICATIONS: None immediate. PROCEDURE: Informed written consent was obtained from the patient after a thorough discussion of the procedural risks, benefits and alternatives. All questions were addressed. Maximal Sterile Barrier  Technique was utilized including caps, mask, sterile gowns, sterile gloves, sterile drape, hand hygiene and skin antiseptic. A timeout was performed prior to the initiation of the procedure. The patient's hand was placed on the exam table. Focused sonographic exam of the dorsum of the right hand demonstrated a small anechoic fluid collection just deep to the subcutaneous tissues measuring 1.6 x 1.3 x 0.8 cm. Skin entry site was marked, and the overlying skin was prepped and draped in the standard sterile fashion. Local analgesia was obtained with 1% lidocaine. Using ultrasound guidance, aspiration of the fluid collection was performed using a 21 gauge needle. Approximately 1 mL of thick serous fluid was aspirated. The sample was sent to the lab for analysis. A clean dressing was placed after hemostasis. The patient tolerated the procedure without immediate complication. IMPRESSION: Successful ultrasound-guided aspiration of fluid collection in the dorsum of the right hand yielding approximately 1 mL of thick serous fluid. Sample sent for microbiology analysis. Electronically Signed   By: Olive Bass M.D.   On: 08/11/2021 16:26            LOS: 4 days       Sunnie Nielsen, DO [facility] 08/15/2021, 4:02 PM   Staff may message me via secure chat in Epic  but this may not receive immediate response,   please page for urgent matters!  If 7PM-7AM, please contact night-coverage [email or web address]  Dictation software was used to generate the above note. Typos may occur and escape review, as with typed/written notes. Please contact Dr Lyn Hollingshead directly for clarity if needed.

## 2021-08-15 NOTE — Transfer of Care (Addendum)
Immediate Anesthesia Transfer of Care Note  Patient: GRAVES NIPP  Procedure(s) Performed: INCISION AND DRAINAGE- right hand/wrist (Right)  Patient Location: PACU  Anesthesia Type:General  Level of Consciousness: drowsy  Airway & Oxygen Therapy: Patient Spontanous Breathing and Patient connected to face mask oxygen  Post-op Assessment: Report given to RN and Post -op Vital signs reviewed and stable  Post vital signs: Reviewed and stable  Last Vitals:  Vitals Value Taken Time  BP    Temp    Pulse    Resp    SpO2      Last Pain:  Vitals:   08/15/21 1631  TempSrc: Temporal  PainSc: 6       Patients Stated Pain Goal: 0 (08/15/21 1631)  Complications: No notable events documented.

## 2021-08-16 ENCOUNTER — Encounter: Payer: Self-pay | Admitting: Orthopaedic Surgery

## 2021-08-16 DIAGNOSIS — B182 Chronic viral hepatitis C: Secondary | ICD-10-CM

## 2021-08-16 DIAGNOSIS — M86141 Other acute osteomyelitis, right hand: Secondary | ICD-10-CM

## 2021-08-16 DIAGNOSIS — M00041 Staphylococcal arthritis, right hand: Secondary | ICD-10-CM

## 2021-08-16 DIAGNOSIS — M86041 Acute hematogenous osteomyelitis, right hand: Secondary | ICD-10-CM

## 2021-08-16 DIAGNOSIS — F1090 Alcohol use, unspecified, uncomplicated: Secondary | ICD-10-CM

## 2021-08-16 DIAGNOSIS — A4901 Methicillin susceptible Staphylococcus aureus infection, unspecified site: Secondary | ICD-10-CM

## 2021-08-16 LAB — BASIC METABOLIC PANEL
Anion gap: 11 (ref 5–15)
BUN: 8 mg/dL (ref 6–20)
CO2: 24 mmol/L (ref 22–32)
Calcium: 8.8 mg/dL — ABNORMAL LOW (ref 8.9–10.3)
Chloride: 107 mmol/L (ref 98–111)
Creatinine, Ser: 0.66 mg/dL (ref 0.61–1.24)
GFR, Estimated: >60 mL/min (ref >60)
Glucose, Bld: 155 mg/dL — ABNORMAL HIGH (ref 70–99)
Potassium: 3.6 mmol/L (ref 3.5–5.1)
Sodium: 142 mmol/L (ref 135–145)

## 2021-08-16 LAB — CBC
HCT: 45.1 % (ref 39.0–52.0)
Hemoglobin: 14.9 g/dL (ref 13.0–17.0)
MCH: 30 pg (ref 26.0–34.0)
MCHC: 33 g/dL (ref 30.0–36.0)
MCV: 90.9 fL (ref 80.0–100.0)
Platelets: 274 10*3/uL (ref 150–400)
RBC: 4.96 MIL/uL (ref 4.22–5.81)
RDW: 14.6 % (ref 11.5–15.5)
WBC: 3.2 10*3/uL — ABNORMAL LOW (ref 4.0–10.5)
nRBC: 0 % (ref 0.0–0.2)

## 2021-08-16 LAB — CULTURE, BLOOD (SINGLE)
Culture: NO GROWTH
Special Requests: ADEQUATE

## 2021-08-16 LAB — AEROBIC/ANAEROBIC CULTURE W GRAM STAIN (SURGICAL/DEEP WOUND)

## 2021-08-16 MED ORDER — CEFAZOLIN SODIUM-DEXTROSE 2-4 GM/100ML-% IV SOLN
2.0000 g | Freq: Three times a day (TID) | INTRAVENOUS | Status: DC
  Administered 2021-08-16 – 2021-08-29 (×38): 2 g via INTRAVENOUS
  Filled 2021-08-16 (×40): qty 100

## 2021-08-16 NOTE — Progress Notes (Signed)
  Subjective:  Patient reports pain as well controlled.  Objective:   VITALS:   Vitals:   08/15/21 1956 08/16/21 0502 08/16/21 0834 08/16/21 1517  BP: 133/83 135/85 126/78 (!) 149/98  Pulse: 76 97 97 99  Resp: 18 20 19 18   Temp: 98 F (36.7 C) 98 F (36.7 C) 97.9 F (36.6 C) 98.1 F (36.7 C)  TempSrc:   Oral Oral  SpO2: 100% 100% 100% 100%  Weight:      Height:        PHYSICAL EXAM:  Right wrist Incision c/d/i, penrose drain in place No notable erythema Right hand is NVI  LABS  Results for orders placed or performed during the hospital encounter of 08/10/21 (from the past 24 hour(s))  CBC     Status: Abnormal   Collection Time: 08/16/21  3:13 AM  Result Value Ref Range   WBC 3.2 (L) 4.0 - 10.5 K/uL   RBC 4.96 4.22 - 5.81 MIL/uL   Hemoglobin 14.9 13.0 - 17.0 g/dL   HCT 08/18/21 02.5 - 85.2 %   MCV 90.9 80.0 - 100.0 fL   MCH 30.0 26.0 - 34.0 pg   MCHC 33.0 30.0 - 36.0 g/dL   RDW 77.8 24.2 - 35.3 %   Platelets 274 150 - 400 K/uL   nRBC 0.0 0.0 - 0.2 %  Basic metabolic panel     Status: Abnormal   Collection Time: 08/16/21  3:13 AM  Result Value Ref Range   Sodium 142 135 - 145 mmol/L   Potassium 3.6 3.5 - 5.1 mmol/L   Chloride 107 98 - 111 mmol/L   CO2 24 22 - 32 mmol/L   Glucose, Bld 155 (H) 70 - 99 mg/dL   BUN 8 6 - 20 mg/dL   Creatinine, Ser 08/18/21 0.61 - 1.24 mg/dL   Calcium 8.8 (L) 8.9 - 10.3 mg/dL   GFR, Estimated 4.31 >54 mL/min   Anion gap 11 5 - 15    DG MINI C-ARM IMAGE ONLY  Result Date: 08/15/2021 There is no interpretation for this exam.  This order is for images obtained during a surgical procedure.  Please See "Surgeries" Tab for more information regarding the procedure.    Assessment/Plan: 1 Day Post-Op  Status post right wrist dorsal midcarpal I&D (08/15/21). - Continue abx treatment per ID (recommend IV cefazolin x 2 weeks per clinical improvement) - Dressing change every other day as needed - Will plan to remove Penrose drain in 5-7  days   08/17/21 , MD 08/16/2021, 8:28 PM

## 2021-08-16 NOTE — Anesthesia Postprocedure Evaluation (Signed)
Anesthesia Post Note  Patient: Douglas Collins  Procedure(s) Performed: INCISION AND DRAINAGE- right hand/wrist (Right)  Patient location during evaluation: PACU Anesthesia Type: General Level of consciousness: awake and alert Pain management: pain level controlled Vital Signs Assessment: post-procedure vital signs reviewed and stable Respiratory status: spontaneous breathing, nonlabored ventilation and respiratory function stable Cardiovascular status: blood pressure returned to baseline and stable Postop Assessment: no apparent nausea or vomiting Anesthetic complications: no   No notable events documented.   Last Vitals:  Vitals:   08/15/21 1956 08/16/21 0502  BP: 133/83 135/85  Pulse: 76 97  Resp: 18 20  Temp: 36.7 C 36.7 C  SpO2: 100% 100%    Last Pain:  Vitals:   08/16/21 0175  TempSrc:   PainSc: 8                  Alysah Carton Romie Minus

## 2021-08-16 NOTE — Hospital Course (Signed)
Douglas Collins is a 36 y.o. male with medical history significant for Hepatitis C, tobacco use disorder, alcohol use disorder drinking 4 cans of beer daily, and occasional IV drug use, recently admitted on 07/19/2021 for bilateral lower extremity cellulitis, signing out AMA the following day, but with negative blood cultures at that time, who presents to the ED with swelling to the dorsal aspect of the right hand for the past 2 days. IR guided aspiration of fluid collection was performed on 5/26, came back with MSSA. Patient had right dorsal metacarpal incision and drainage and irrigation performed on 5/30.  Culture from that is still pending.  Patient currently treated with Rocephin and vancomycin.

## 2021-08-16 NOTE — Plan of Care (Signed)

## 2021-08-16 NOTE — Consult Note (Signed)
NAME: Douglas Collins  DOB: [birth year]  MRN: [id number]  Date/Time: 08/16/2021 3:20 PM  REQUESTING PROVIDER: Dr. Roosevelt Locks Subjective:  REASON FOR CONSULT: MSSA hand infection ? Douglas Collins is a 36 y.o. male with a history of HEP C,  Substance use disorder IVDA presented with painful swelling rt hand X 1 week on 08/10/21 He was in the hospital on 07/19/21 for b/l leg cellultiis and signed out AMA the next day- blood culture then was negative. He says he noticed swelling on the dorsum of the rt hand- He did not mainline in 1 year he says. There was no wound- there was pain as well and he thought it was due to the previous knuckle fracture he had . Then it got worse and he came ot the ED- he is homeless , sleeping in friends place In the ED BP was 123/83, temperature 98.8, heart rate 72 and sats 100%. WBC 1.9, Hb 15.4, platelet 255 and creatinine 0.5. US of the rt hand showed a fluid collection concernign for deep tissue abscess along the dorsum of the rt hand He was started on IV vanco and Iv ceftriaxone. IR aspirated the fluid collection on 08/11/21. As there was not much improvement he had an MRI and that showed persistent fluid collection and questionable septic arthritis versus osteomyelitis and he was taken for I/D by ortho on 08/15/21 He underwent I/D yesterday and as per ortho note 2 cm incision was made just distal to the radiocarpal joint between the third and fourth extensor compartment in line with location of fluid collection seen on MRI.   1 to 2 cc of serosanguineous fluid was expressed from the joint.  This was cultured.  1 L antibiotic irrigation was then utilized for irrigation.  Penrose drain was placed.  4-0 Chromic Gut suture was utilized to close the skin.  Culture from 5/26 has now finalized as MSSA and I am seeing the patient for the same. Patient denies any prior infection secondary to IV drug use. He did not have any endocarditis in the past He has history of hepatitis  C and has not been treated.  He says he has not done IV DA in some time.  He is positive for cocaine and endorses using cocaine  Past Medical History:  Diagnosis Date   Hepatitis C     Past Surgical History:  Procedure Laterality Date   INCISION AND DRAINAGE Right 08/15/2021   Procedure: INCISION AND DRAINAGE- right hand/wrist;  Surgeon: Renee Harder, MD;  Location: [facility] ORS;  Service: Orthopedics;  Laterality: Right;   TONSILLECTOMY Bilateral 1989   age 63 per pt    Social History   Socioeconomic History   Marital status: Single    Spouse name: Not on file   Number of children: Not on file   Years of education: Not on file   Highest education level: Not on file  Occupational History   Not on file  Tobacco Use   Smoking status: Some Days    Packs/day: 0.50    Years: 26.00    Pack years: 13.00    Types: Cigarettes   Smokeless tobacco: Former    Types: Nurse, children's Use: Never used  Substance and Sexual Activity   Alcohol use: Yes    Comment: two 24 ounce beers a day since Friday   Drug use: Not Currently    Comment: 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 no other drug use in last 5 1/2 years  Sexual activity: Yes  Other Topics Concern   Not on file  Social History Narrative   Not on file   Social Determinants of Health   Financial Resource Strain: Not on file  Food Insecurity: Not on file  Transportation Needs: Not on file  Physical Activity: Not on file  Stress: Not on file  Social Connections: Not on file  Intimate Partner Violence: Not on file    History reviewed. No pertinent family history. No Known Allergies I? Current Facility-Administered Medications  Medication Dose Route Frequency Provider Last Rate Last Admin   acetaminophen (TYLENOL) tablet 650 mg  650 mg Oral Q6H PRN Renee Harder, MD       Or   acetaminophen (TYLENOL) suppository 650 mg  650 mg Rectal Q6H PRN Renee Harder, MD       cefTRIAXone (ROCEPHIN) 1 g in sodium chloride 0.9  % 100 mL IVPB  1 g Intravenous Q24H Renee Harder, MD   Stopped at 22/29/79 8921   folic acid (FOLVITE) tablet 1 mg  1 mg Oral Daily Renee Harder, MD   1 mg at 08/16/21 1941   HYDROcodone-acetaminophen (NORCO/VICODIN) 5-325 MG per tablet 1-2 tablet  1-2 tablet Oral Q4H PRN Renee Harder, MD   2 tablet at 08/16/21 1429   morphine (PF) 2 MG/ML injection 2 mg  2 mg Intravenous Q2H PRN Renee Harder, MD   2 mg at 08/16/21 0605   multivitamin with minerals tablet 1 tablet  1 tablet Oral Daily Renee Harder, MD   1 tablet at 08/16/21 0913   ondansetron [facility]) tablet 4 mg  4 mg Oral Q6H PRN Renee Harder, MD       Or   ondansetron [facility]) injection 4 mg  4 mg Intravenous Q6H PRN Renee Harder, MD       thiamine tablet 100 mg  100 mg Oral Daily Renee Harder, MD   100 mg at 08/16/21 7408   Or   thiamine (B-1) injection 100 mg  100 mg Intravenous Daily Renee Harder, MD       vancomycin [facility]) IVPB 1500 mg/300 mL  1,500 mg Intravenous Waynard Edwards, MD 150 mL/hr at 08/16/21 1426 1,500 mg at 08/16/21 1426     Abtx:  Anti-infectives (From admission, onward)    Start     Dose/Rate Route Frequency Ordered Stop   08/15/21 1822  gentamicin (GARAMYCIN) 80 mg in sodium chloride 0.9 % 500 mL irrigation  Status:  Discontinued          As needed 08/15/21 1822 08/15/21 1836   08/12/21 0200  cefTRIAXone (ROCEPHIN) 1 g in sodium chloride 0.9 % 100 mL IVPB        1 g 200 mL/hr over 30 Minutes Intravenous Every 24 hours 08/11/21 0614     08/11/21 1430  vancomycin (VANCOREADY) IVPB 1500 mg/300 mL        1,500 mg 150 mL/hr over 120 Minutes Intravenous Every 12 hours 08/11/21 0256     08/11/21 0015  vancomycin (VANCOREADY) IVPB 1750 mg/350 mL        1,750 mg 175 mL/hr over 120 Minutes Intravenous  Once 08/11/21 0011 08/11/21 0754   08/11/21 0015  cefTRIAXone (ROCEPHIN) 2 g in sodium chloride 0.9 % 100 mL IVPB        2 g 200 mL/hr over 30 Minutes Intravenous   Once 08/11/21 0011 08/11/21 0233       REVIEW OF SYSTEMS:  Const: negative fever, negative chills, negative weight loss Eyes: negative  diplopia or visual changes, negative eye pain ENT: negative coryza, negative sore throat Resp: negative cough, hemoptysis, dyspnea Cards: negative for chest pain, palpitations, lower extremity edema GU: negative for frequency, dysuria and hematuria GI: Negative for abdominal pain, diarrhea, bleeding, constipation Skin: negative for rash and pruritus Heme: negative for easy bruising and gum/nose bleeding MS: negative for myalgias, arthralgias, back pain and muscle weakness Neurolo:negative for headaches, dizziness, vertigo, memory problems  Psych: negative for feelings of anxiety, depression  Endocrine: negative for thyroid, diabetes Allergy/Immunology- negative for any medication or food allergies ?  Objective:  VITALS:  BP (!) 149/98 (BP Location: Left Arm)   Pulse 99   Temp 98.1 F (36.7 C) (Oral)   Resp 18   Ht '5\' 11"'  (1.803 m)   Wt 72.6 kg   SpO2 100%   BMI 22.32 kg/m   PHYSICAL EXAM:  General: Alert, cooperative, no distress, appears stated age.  Head: Normocephalic, without obvious abnormality, atraumatic. Eyes: Conjunctivae clear, anicteric sclerae. Pupils are equal ENT Nares normal. No drainage or sinus tenderness. Lips, mucosa, and tongue normal. No Thrush Neck: Supple, symmetrical, no adenopathy, thyroid: non tender no carotid bruit and no JVD. Back: No CVA tenderness. Lungs: Clear to auscultation bilaterally. No Wheezing or Rhonchi. No rales. Heart: Regular rate and rhythm, no murmur, rub or gallop. Abdomen: Soft, non-tender,not distended. Bowel sounds normal. No masses Extremities: Right hand surgical dressing not removed. skin: No rashes or lesions. Or bruising Lymph: Cervical, supraclavicular normal. Neurologic: Grossly non-focal Pertinent Labs Lab Results CBC    Component Value Date/Time   WBC 3.2 (L) 08/16/2021  0313   RBC 4.96 08/16/2021 0313   HGB 14.9 08/16/2021 0313   HGB 14.2 06/03/2013 1420   HCT 45.1 08/16/2021 0313   HCT 41.3 06/03/2013 1420   PLT 274 08/16/2021 0313   PLT 125 (L) 06/03/2013 1420   MCV 90.9 08/16/2021 0313   MCV 94 06/03/2013 1420   MCH 30.0 08/16/2021 0313   MCHC 33.0 08/16/2021 0313   RDW 14.6 08/16/2021 0313   RDW 14.2 06/03/2013 1420   LYMPHSABS 2.0 08/11/2021 0137   MONOABS 0.8 08/11/2021 0137   EOSABS 0.1 08/11/2021 0137   BASOSABS 0.0 08/11/2021 0137       Latest Ref Rng & Units 08/16/2021    3:13 AM 08/15/2021    3:30 AM 08/13/2021    3:50 AM  CMP  Glucose 70 - 99 mg/dL 155    100    BUN 6 - 20 mg/dL 8    9    Creatinine 0.61 - 1.24 mg/dL 0.66   0.41   0.53    Sodium 135 - 145 mmol/L 142    135    Potassium 3.5 - 5.1 mmol/L 3.6    3.6    Chloride 98 - 111 mmol/L 107    101    CO2 22 - 32 mmol/L 24    27    Calcium 8.9 - 10.3 mg/dL 8.8    8.7        Microbiology: Recent Results (from the past 240 hour(s))  Blood culture (routine single)     Status: None   Collection Time: 08/11/21  1:37 AM   Specimen: BLOOD  Result Value Ref Range Status   Specimen Description BLOOD RIGHT UPPER ARM  Final   Special Requests   Final    BOTTLES DRAWN AEROBIC AND ANAEROBIC Blood Culture adequate volume   Culture   Final    NO GROWTH 5 DAYS  Performed at [facility], [address] [place], [place] [address]    Report Status 08/16/2021 FINAL  Final  Aerobic/Anaerobic Culture w Gram Stain (surgical/deep wound)     Status: None   Collection Time: 08/11/21  4:15 PM   Specimen: Abscess  Result Value Ref Range Status   Specimen Description ABSCESS  Final   Special Requests RIGHT HAND  Final   Gram Stain   Final    ABUNDANT WBC PRESENT, PREDOMINANTLY PMN NO ORGANISMS SEEN    Culture   Final    RARE STAPHYLOCOCCUS AUREUS NO ANAEROBES ISOLATED Performed at [facility], [place] [address] [place], [place] [address]    Report Status 08/16/2021  FINAL  Final   Organism ID, Bacteria STAPHYLOCOCCUS AUREUS  Final      Susceptibility   Staphylococcus aureus - MIC*    CIPROFLOXACIN <=0.5 SENSITIVE Sensitive     ERYTHROMYCIN <=0.25 SENSITIVE Sensitive     GENTAMICIN <=0.5 SENSITIVE Sensitive     OXACILLIN 0.5 SENSITIVE Sensitive     TETRACYCLINE <=1 SENSITIVE Sensitive     VANCOMYCIN 1 SENSITIVE Sensitive     TRIMETH/SULFA <=10 SENSITIVE Sensitive     CLINDAMYCIN <=0.25 SENSITIVE Sensitive     RIFAMPIN <=0.5 SENSITIVE Sensitive     Inducible Clindamycin NEGATIVE Sensitive     * RARE STAPHYLOCOCCUS AUREUS  Aerobic/Anaerobic Culture w Gram Stain (surgical/deep wound)     Status: None (Preliminary result)   Collection Time: 08/15/21  5:54 PM   Specimen: PATH Other  Result Value Ref Range Status   Specimen Description   Final    HAND RIGHT INFECTION Performed at [facility], [address] [place], [place] [address]    Special Requests   Final    NONE Performed at [facility], [address] [place], [place] [address]    Gram Stain   Final    FEW WBC PRESENT,BOTH PMN AND MONONUCLEAR NO ORGANISMS SEEN    Culture   Final    NO GROWTH < 24 HOURS Performed at [facility], [place] [address], [place],  [address]    Report Status PENDING  Incomplete    IMAGING RESULTS:  I have personally reviewed the films ?fluid collection at the dorsal aspect of the wrist measuring 3.8X0.7X0.6cm Enhancement throughoput the carpal bones rasing concern for septic arthritis  Impression/Recommendation ? MSSA infection of the dorsum of the hand which is deep and involving the carpal bones with questionable septic arthritis and osteomyelitis Status post incision and drainage.  No bone biopsy or bone culture was sent.  Previous culture with Staph aureus Patient is currently on vancomycin and ceftriaxone.  We will discontinue both and start him on IV cefazolin.  Because he is IVDA cannot be sent home with PICC.   Recommend IV cefazolin for minimum of 2 weeks IV may need up to 4 to 6 weeks.  This will depend on his progression. We will check ESR and CRP  [facility] - treatment as OP  ___________________________________________________ Discussed with patient, requesting provider Note:  This document was prepared using Dragon voice recognition software and may include unintentional dictation errors.

## 2021-08-16 NOTE — Progress Notes (Signed)
  Progress Note   Patient: Douglas Collins [id number]:[id number] DOB: 1985-03-23 DOA: 08/10/2021     5 DOS: the patient was seen and examined on 08/16/2021   Brief hospital course: Douglas Collins is a 36 y.o. male with medical history significant for Hepatitis C, tobacco use disorder, alcohol use disorder drinking 4 cans of beer daily, and occasional IV drug use, recently admitted on 07/19/2021 for bilateral lower extremity cellulitis, signing out AMA the following day, but with negative blood cultures at that time, who presents to the ED with swelling to the dorsal aspect of the right hand for the past 2 days. IR guided aspiration of fluid collection was performed on 5/26, came back with MSSA. Patient had right dorsal metacarpal incision and drainage and irrigation performed on 5/30.  Culture from that is still pending.  Patient currently treated with Rocephin and vancomycin.  Assessment and Plan: Right hand abscess. Right hand osteomyelitis. Initial culture grew MSSA, pansensitive.  Culture from surgery still pending. Patient may need long-term antibiotics due to osteomyelitis.  However, due to concern of drug abuse, reluctant to place a PICC line.  Will obtain ID consult to help with proper antibiotics.  Patient probably can be discharged home tomorrow after culture from surgery come back tomorrow.  Hepatitis C. Follow-up with GI as outpatient.  Alcohol use disorder,  tobacco abuse. History of IV drug abuse. Advised to quit      Subjective:  Patient still complaining of pain in the right wrist, no fever chills or nausea vomiting.  No diarrhea.  Physical Exam: Vitals:   08/15/21 1956 08/16/21 0502 08/16/21 0834 08/16/21 1517  BP: 133/83 135/85 126/78 (!) 149/98  Pulse: 76 97 97 99  Resp: 18 20 19 18   Temp: 98 F (36.7 C) 98 F (36.7 C) 97.9 F (36.6 C) 98.1 F (36.7 C)  TempSrc:   Oral Oral  SpO2: 100% 100% 100% 100%  Weight:      Height:       General exam: Appears  calm and comfortable  Respiratory system: Clear to auscultation. Respiratory effort normal. Cardiovascular system: S1 & S2 heard, RRR. No JVD, murmurs, rubs, gallops or clicks. No pedal edema. Gastrointestinal system: Abdomen is nondistended, soft and nontender. No organomegaly or masses felt. Normal bowel sounds heard. Central nervous system: Alert and oriented. No focal neurological deficits. Extremities: Right hand swelling Skin: No rashes, lesions or ulcers Psychiatry: Judgement and insight appear normal. Mood & affect appropriate.   Data Reviewed:  Reviewed MRI results, all lab results and culture results  Family Communication: Girlfriend at bedside.  Disposition: Status is: Inpatient Remains inpatient appropriate because: Severity of disease, IV treatment  Planned Discharge Destination: Home    Time spent: 29 minutes  Author: , MD 08/16/2021 3:33 PM  For on call review [email or web address].

## 2021-08-17 DIAGNOSIS — M86041 Acute hematogenous osteomyelitis, right hand: Secondary | ICD-10-CM

## 2021-08-17 NOTE — TOC Progression Note (Signed)
Transition of Care [facility]) - Progression Note    Patient Details  Name: Douglas Collins MRN: [id number] Date of Birth: [birth year]  Transition of Care [facility]) CM/SW [place], LCSW Phone Number: 08/17/2021, 9:43 AM  Clinical Narrative:  Went by room to give PCP and SA resources. Patient and girlfriend asleep. Put resources on chart to go home with him at discharge.   Expected Discharge Plan: Home/Self Care Barriers to Discharge: Continued Medical Work up  Expected Discharge Plan and Services Expected Discharge Plan: Home/Self Care                                               Social Determinants of Health (SDOH) Interventions    Readmission Risk Interventions     View : No data to display.

## 2021-08-17 NOTE — Plan of Care (Signed)

## 2021-08-17 NOTE — Progress Notes (Signed)
  Progress Note   Patient: Douglas Collins DOB: 1985/08/27 DOA: 08/10/2021     6 DOS: the patient was seen and examined on 08/17/2021   Brief hospital course: Douglas Collins is a 36 y.o. male with medical history significant for Hepatitis C, tobacco use disorder, alcohol use disorder drinking 4 cans of beer daily, and occasional IV drug use, recently admitted on 07/19/2021 for bilateral lower extremity cellulitis, signing out AMA the following day, but with negative blood cultures at that time, who presents to the ED with swelling to the dorsal aspect of the right hand for the past 2 days. IR guided aspiration of fluid collection was performed on 5/26, came back with MSSA. Patient had right dorsal metacarpal incision and drainage and irrigation performed on 5/30, culture had no growth. MRI showed osteomyelitis.  Patient has been seen by ID, patient will need antibiotics for at least 2 weeks, potentially 4 to 6 weeks.  Currently on cefazolin.  Patient had history of IV drug abuse, could not be discharged to home with IV line.  Patient has to finish antibiotic in the hospital.  Assessment and Plan: Right hand abscess secondary to MSSA. 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  We will need to finish antibiotics while in the hospital.  No discharge options.   Hepatitis C. Follow-up with GI as outpatient.  Alcohol use disorder,  tobacco abuse. History of IV drug abuse. Advised to quit      Subjective:  Patient still has some pain in the right hand, but overall improving.  Physical Exam: Vitals:   08/16/21 1517 08/16/21 2241 08/17/21 0741 08/17/21 1135  BP: (!) 149/98 128/84 126/69 125/84  Pulse: 99 100 78 78  Resp: 18 20 16 18   Temp: 98.1 F (36.7 C) 97.7 F (36.5 C) 98.1 F (36.7 C) 97.9 F (36.6 C)  TempSrc: Oral     SpO2: 100% 100% 98% 100%  Weight:      Height:       General exam: Appears calm and  comfortable  Respiratory system: Clear to auscultation. Respiratory effort normal. Cardiovascular system: S1 & S2 heard, RRR. No JVD, murmurs, rubs, gallops or clicks. No pedal edema. Gastrointestinal system: Abdomen is nondistended, soft and nontender. No organomegaly or masses felt. Normal bowel sounds heard. Central nervous system: Alert and oriented. No focal neurological deficits. Extremities: Symmetric 5 x 5 power. Skin: No rashes, lesions or ulcers Psychiatry: Judgement and insight appear normal. Mood & affect appropriate.   Data Reviewed:  Culture results reviewed.  Family Communication: Girlfriend updated at bedside.  Disposition: Status is: Inpatient Remains inpatient appropriate because: IV antibiotics, no safe discharge option.  Planned Discharge Destination:  No option    Time spent: 28 minutes  Author: , MD 08/17/2021 3:38 PM  For on call review [email or web address].

## 2021-08-17 NOTE — Plan of Care (Signed)
  Problem: Clinical Measurements: Goal: Ability to avoid or minimize complications of infection will improve Outcome: Progressing   Problem: Skin Integrity: Goal: Skin integrity will improve Outcome: Progressing   Problem: Education: Goal: Knowledge of General Education information will improve Description: Including pain rating scale, medication(s)/side effects and non-pharmacologic comfort measures Outcome: Progressing   Problem: Health Behavior/Discharge Planning: Goal: Ability to manage health-related needs will improve Outcome: Progressing   Problem: Clinical Measurements: Goal: Ability to maintain clinical measurements within normal limits will improve Outcome: Progressing   Problem: Activity: Goal: Risk for activity intolerance will decrease Outcome: Progressing   Problem: Nutrition: Goal: Adequate nutrition will be maintained Outcome: Progressing   Problem: Coping: Goal: Level of anxiety will decrease Outcome: Progressing   Problem: Pain Managment: Goal: General experience of comfort will improve Outcome: Progressing   Problem: Safety: Goal: Ability to remain free from injury will improve Outcome: Progressing   Problem: Skin Integrity: Goal: Risk for impaired skin integrity will decrease Outcome: Progressing   

## 2021-08-18 MED ORDER — TRAZODONE HCL 50 MG PO TABS
50.0000 mg | ORAL_TABLET | Freq: Every evening | ORAL | Status: DC | PRN
Start: 2021-08-18 — End: 2021-08-29
  Administered 2021-08-18 – 2021-08-27 (×3): 50 mg via ORAL
  Filled 2021-08-18 (×3): qty 1

## 2021-08-18 NOTE — Progress Notes (Addendum)
ID Patient doing better Girlfriend at bedside No fever Pain in the right hand is better   On examination awake and alert Patient Vitals for the past 24 hrs:  BP Temp Temp src Pulse Resp SpO2  08/18/21 1611 123/77 98.3 F (36.8 C) Oral 81 -- 100 %  08/18/21 1152 123/78 -- -- (!) 55 -- 97 %  08/18/21 0902 114/67 -- -- (!) 54 -- 99 %  08/18/21 0528 (!) 102/56 (!) 97.5 F (36.4 C) -- 75 18 96 %  Right hand dressing removed Penrose drain present.  Some purulent discharge Swelling and erythema much improved    Labs    Latest Ref Rng & Units 08/16/2021    3:13 AM 08/15/2021    3:30 AM 08/13/2021    3:50 AM  CBC  WBC 4.0 - 10.5 K/uL 3.2   4.7   4.7    Hemoglobin 13.0 - 17.0 g/dL 16.1   09.6   04.5    Hematocrit 39.0 - 52.0 % 45.1   47.2   42.6    Platelets 150 - 400 K/uL 274   252   219          Latest Ref Rng & Units 08/16/2021    3:13 AM 08/15/2021    3:30 AM 08/13/2021    3:50 AM  CMP  Glucose 70 - 99 mg/dL 409    811    BUN 6 - 20 mg/dL 8    9    Creatinine 9.14 - 1.24 mg/dL 7.82   9.56   2.13    Sodium 135 - 145 mmol/L 142    135    Potassium 3.5 - 5.1 mmol/L 3.6    3.6    Chloride 98 - 111 mmol/L 107    101    CO2 22 - 32 mmol/L 24    27    Calcium 8.9 - 10.3 mg/dL 8.8    8.7       Micro Right hand abscess culture Staph aureus  Impression/recommendation  Staphylococcus aureus infection of the dorsum of the hand.  Concerning for involvement of the carpal bones with question of septic arthritis and osteomyelitis. Patient is currently on IV cefazolin Would recommend a minimum of 2 weeks of IV from the time of incision and drainage.  After which we can consider p.o.  But this will depend on his progression. Penrose drain still in place  History of IV drug use.  Not a candidate for PICC on discharge  Hepatitis C.  Treatment as outpatient Discussed the management with the patient .

## 2021-08-18 NOTE — Plan of Care (Signed)

## 2021-08-18 NOTE — Plan of Care (Signed)
  Problem: Clinical Measurements: Goal: Ability to avoid or minimize complications of infection will improve Outcome: Progressing   Problem: Skin Integrity: Goal: Skin integrity will improve Outcome: Progressing   Problem: Education: Goal: Knowledge of General Education information will improve Description: Including pain rating scale, medication(s)/side effects and non-pharmacologic comfort measures Outcome: Progressing   Problem: Health Behavior/Discharge Planning: Goal: Ability to manage health-related needs will improve Outcome: Progressing   Problem: Clinical Measurements: Goal: Ability to maintain clinical measurements within normal limits will improve Outcome: Progressing   Problem: Clinical Measurements: Goal: Will remain free from infection Outcome: Progressing   Problem: Clinical Measurements: Goal: Cardiovascular complication will be avoided Outcome: Progressing   Problem: Activity: Goal: Risk for activity intolerance will decrease Outcome: Progressing   Problem: Nutrition: Goal: Adequate nutrition will be maintained Outcome: Progressing   Problem: Coping: Goal: Level of anxiety will decrease Outcome: Progressing   Problem: Elimination: Goal: Will not experience complications related to bowel motility Outcome: Progressing   Problem: Safety: Goal: Ability to remain free from injury will improve Outcome: Progressing   Problem: Skin Integrity: Goal: Risk for impaired skin integrity will decrease Outcome: Progressing

## 2021-08-18 NOTE — Progress Notes (Signed)
  Progress Note   Patient: Douglas Collins [id number] DOB: Nov 06, 1985 DOA: 08/10/2021     7 DOS: the patient was seen and examined on 08/18/2021   Brief hospital course: Douglas Collins is a 36 y.o. male with medical history significant for Hepatitis C, tobacco use disorder, alcohol use disorder drinking 4 cans of beer daily, and occasional IV drug use, recently admitted on 07/19/2021 for bilateral lower extremity cellulitis, signing out AMA the following day, but with negative blood cultures at that time, who presents to the ED with swelling to the dorsal aspect of the right hand for the past 2 days. IR guided aspiration of fluid collection was performed on 5/26, came back with MSSA. Patient had right dorsal metacarpal incision and drainage and irrigation performed on 5/30, culture had no growth. MRI showed osteomyelitis.  Patient has been seen by ID, patient will need antibiotics for at least 2 weeks, potentially 4 to 6 weeks.  Currently on cefazolin.  Patient had history of IV drug abuse, could not be discharged to home with IV line.  Patient has to finish antibiotic in the hospital.  Assessment and Plan: Right hand abscess secondary to MSSA. 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  Probably can change to oral antibiotics after that for additional 4 weeks.   Hepatitis C. Follow-up with GI as outpatient.  Alcohol use disorder,  tobacco abuse. History of IV drug abuse. No evidence of alcohol withdrawal.      Subjective:  Patient still has some pain in the right hand, but condition overall improving.  Physical Exam: Vitals:   08/17/21 1941 08/18/21 0528 08/18/21 0902 08/18/21 1152  BP: (!) 141/94 (!) 102/56 114/67 123/78  Pulse: 65 75 (!) 54 (!) 55  Resp: 16 18    Temp: 97.7 F (36.5 C) (!) 97.5 F (36.4 C)    TempSrc:      SpO2: 100% 96% 99% 97%  Weight:      Height:       General exam: Appears  calm and comfortable  Respiratory system: Clear to auscultation. Respiratory effort normal. Cardiovascular system: S1 & S2 heard, RRR. No JVD, murmurs, rubs, gallops or clicks. No pedal edema. Gastrointestinal system: Abdomen is nondistended, soft and nontender. No organomegaly or masses felt. Normal bowel sounds heard. Central nervous system: Alert and oriented. No focal neurological deficits. Extremities: Symmetric 5 x 5 power. Skin: No rashes, lesions or ulcers Psychiatry: Judgement and insight appear normal. Mood & affect appropriate.   Data Reviewed:  Culture from surgery still negative.  Family Communication: Girlfriend updated at bedside.  Disposition: Status is: Inpatient Remains inpatient appropriate because: Unsafe discharge  Planned Discharge Destination: Home    Time spent: 21 minutes  Author: Marrion Coy, MD 08/18/2021 12:18 PM  For on call review [email or web address].

## 2021-08-19 LAB — C-REACTIVE PROTEIN: CRP: 0.6 mg/dL (ref <1.0)

## 2021-08-19 LAB — SEDIMENTATION RATE: Sed Rate: 6 mm/hr (ref 0–15)

## 2021-08-19 NOTE — Progress Notes (Signed)
  Subjective:  Patient reports pain as mild.  No other complaints.  Objective:   VITALS:   Vitals:   08/18/21 1611 08/18/21 2036 08/19/21 0815 08/19/21 1549  BP: 123/77 122/89 119/69 125/75  Pulse: 81 (!) 101 79 (!) 50  Resp:  17 16 16   Temp: 98.3 F (36.8 C) 98 F (36.7 C) 98.5 F (36.9 C) 98.3 F (36.8 C)  TempSrc: Oral     SpO2: 100% 99% 98% 100%  Weight:      Height:        PHYSICAL EXAM:  Sensation intact distally No cellulitis present Compartment soft Good cap refill, dressing is clean and dry  LABS  Results for orders placed or performed during the hospital encounter of 08/10/21 (from the past 24 hour(s))  Sedimentation rate     Status: None   Collection Time: 08/19/21  5:16 AM  Result Value Ref Range   Sed Rate 6 0 - 15 mm/hr  C-reactive protein     Status: None   Collection Time: 08/19/21  5:16 AM  Result Value Ref Range   CRP 0.6 <1.0 mg/dL    No results found.  Assessment/Plan: 4 Days Post-Op   Principal Problem:   Abscess of dorsum of hand, right Active Problems:   Hepatitis C   Alcohol use disorder   Tobacco use disorder   History of intravenous drug abuse (HCC)   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)   Continue ABX therapy per ID Keep drain in place until seen by Dr. 10/19/21 on Monday Elevate extremity   Friday , MD 08/19/2021, 7:49 PM

## 2021-08-19 NOTE — Progress Notes (Signed)
  Progress Note   Patient: Douglas Collins [id number] DOB: 1986/03/02 DOA: 08/10/2021     8 DOS: the patient was seen and examined on 08/19/2021   Brief hospital course: Douglas Collins is a 36 y.o. male with medical history significant for Hepatitis C, tobacco use disorder, alcohol use disorder drinking 4 cans of beer daily, and occasional IV drug use, recently admitted on 07/19/2021 for bilateral lower extremity cellulitis, signing out AMA the following day, but with negative blood cultures at that time, who presents to the ED with swelling to the dorsal aspect of the right hand for the past 2 days. IR guided aspiration of fluid collection was performed on 5/26, came back with MSSA. Patient had right dorsal metacarpal incision and drainage and irrigation performed on 5/30, culture had no growth. MRI showed osteomyelitis.  Patient has been seen by ID, patient will need antibiotics for at least 2 weeks, potentially 4 to 6 weeks.  Currently on cefazolin.  Patient had history of IV drug abuse, could not be discharged to home with IV line.  Patient has to finish antibiotic in the hospital.  Assessment and Plan: Right hand abscess secondary to MSSA. 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   Hepatitis C. Follow-up with GI as outpatient.  Alcohol use disorder,  tobacco abuse. History of IV drug abuse. Doing well, no agitation.  Recheck labs tomorrow.      Subjective:  Complaining of pain in the right hand, controlled with oral pain medicine. No fever or chills.  Physical Exam: Vitals:   08/18/21 1152 08/18/21 1611 08/18/21 2036 08/19/21 0815  BP: 123/78 123/77 122/89 119/69  Pulse: (!) 55 81 (!) 101 79  Resp:   17 16  Temp:  98.3 F (36.8 C) 98 F (36.7 C) 98.5 F (36.9 C)  TempSrc:  Oral    SpO2: 97% 100% 99% 98%  Weight:      Height:       General exam: Appears calm and comfortable  Respiratory system: Clear to auscultation. Respiratory  effort normal. Cardiovascular system: S1 & S2 heard, RRR. No JVD, murmurs, rubs, gallops or clicks. No pedal edema. Gastrointestinal system: Abdomen is nondistended, soft and nontender. No organomegaly or masses felt. Normal bowel sounds heard. Central nervous system: Alert and oriented. No focal neurological deficits. Extremities: Symmetric 5 x 5 power. Skin: No rashes, lesions or ulcers Psychiatry: Judgement and insight appear normal. Mood & affect appropriate.   Data Reviewed:  Lab reviewed  Family Communication: Girlfriend at bedside.  Disposition: Status is: Inpatient Remains inpatient appropriate because: Unsafe discharge.  Planned Discharge Destination: Home    Time spent: 25 minutes  Author: Marrion Coy, MD 08/19/2021 11:56 AM  For on call review [email or web address].

## 2021-08-20 DIAGNOSIS — E876 Hypokalemia: Secondary | ICD-10-CM

## 2021-08-20 LAB — CBC
HCT: 39.6 % (ref 39.0–52.0)
Hemoglobin: 13.3 g/dL (ref 13.0–17.0)
MCH: 30 pg (ref 26.0–34.0)
MCHC: 33.6 g/dL (ref 30.0–36.0)
MCV: 89.4 fL (ref 80.0–100.0)
Platelets: 282 10*3/uL (ref 150–400)
RBC: 4.43 MIL/uL (ref 4.22–5.81)
RDW: 14 % (ref 11.5–15.5)
WBC: 4.2 10*3/uL (ref 4.0–10.5)
nRBC: 0 % (ref 0.0–0.2)

## 2021-08-20 LAB — BASIC METABOLIC PANEL
Anion gap: 6 (ref 5–15)
BUN: 12 mg/dL (ref 6–20)
CO2: 26 mmol/L (ref 22–32)
Calcium: 8.4 mg/dL — ABNORMAL LOW (ref 8.9–10.3)
Chloride: 106 mmol/L (ref 98–111)
Creatinine, Ser: 0.48 mg/dL — ABNORMAL LOW (ref 0.61–1.24)
GFR, Estimated: >60 mL/min (ref >60)
Glucose, Bld: 104 mg/dL — ABNORMAL HIGH (ref 70–99)
Potassium: 3.3 mmol/L — ABNORMAL LOW (ref 3.5–5.1)
Sodium: 138 mmol/L (ref 135–145)

## 2021-08-20 LAB — AEROBIC/ANAEROBIC CULTURE W GRAM STAIN (SURGICAL/DEEP WOUND): Culture: NO GROWTH

## 2021-08-20 LAB — MAGNESIUM: Magnesium: 2 mg/dL (ref 1.7–2.4)

## 2021-08-20 MED ORDER — POTASSIUM CHLORIDE 20 MEQ PO PACK
40.0000 meq | PACK | ORAL | Status: AC
  Administered 2021-08-20 (×2): 40 meq via ORAL
  Filled 2021-08-20 (×2): qty 2

## 2021-08-20 NOTE — Progress Notes (Signed)
  Subjective:  Patient reports pain as mild.  Sleeping.  Objective:   VITALS:   Vitals:   08/19/21 2040 08/20/21 0617 08/20/21 0800 08/20/21 1125  BP: (!) 142/88 113/75 120/69 120/75  Pulse: (!) 55 (!) 48 (!) 53 71  Resp: 17 17 16 15   Temp: 98 F (36.7 C) 97.8 F (36.6 C) 97.7 F (36.5 C) 97.7 F (36.5 C)  TempSrc:  Oral    SpO2: 100%  97% 96%  Weight:      Height:        PHYSICAL EXAM:  Neurovascular intact No cellulitis present Compartment soft Good cap refill  LABS  Results for orders placed or performed during the hospital encounter of 08/10/21 (from the past 24 hour(s))  Basic metabolic panel     Status: Abnormal   Collection Time: 08/20/21  4:54 AM  Result Value Ref Range   Sodium 138 135 - 145 mmol/L   Potassium 3.3 (L) 3.5 - 5.1 mmol/L   Chloride 106 98 - 111 mmol/L   CO2 26 22 - 32 mmol/L   Glucose, Bld 104 (H) 70 - 99 mg/dL   BUN 12 6 - 20 mg/dL   Creatinine, Ser 10/20/21 (L) 0.61 - 1.24 mg/dL   Calcium 8.4 (L) 8.9 - 10.3 mg/dL   GFR, Estimated 6.29 >52 mL/min   Anion gap 6 5 - 15  Magnesium     Status: None   Collection Time: 08/20/21  4:54 AM  Result Value Ref Range   Magnesium 2.0 1.7 - 2.4 mg/dL  CBC     Status: None   Collection Time: 08/20/21  4:54 AM  Result Value Ref Range   WBC 4.2 4.0 - 10.5 K/uL   RBC 4.43 4.22 - 5.81 MIL/uL   Hemoglobin 13.3 13.0 - 17.0 g/dL   HCT 10/20/21 13.2 - 44.0 %   MCV 89.4 80.0 - 100.0 fL   MCH 30.0 26.0 - 34.0 pg   MCHC 33.6 30.0 - 36.0 g/dL   RDW 10.2 72.5 - 36.6 %   Platelets 282 150 - 400 K/uL   nRBC 0.0 0.0 - 0.2 %    No results found.  Assessment/Plan: 5 Days Post-Op   Principal Problem:   Abscess of dorsum of hand, right Active Problems:   Hepatitis C   Alcohol use disorder   Tobacco use disorder   History of intravenous drug abuse (HCC)   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)   Continue IV antibiotics Clinically improved Plan per ID and hospitalist Please call with  questions   44.0 , MD 08/20/2021, 11:50 AM

## 2021-08-20 NOTE — Progress Notes (Signed)
  Progress Note   Patient: Douglas Collins [id number] DOB: 05/27/1985 DOA: 08/10/2021     9 DOS: the patient was seen and examined on 08/20/2021   Brief hospital course: Douglas Collins is a 36 y.o. male with medical history significant for Hepatitis C, tobacco use disorder, alcohol use disorder drinking 4 cans of beer daily, and occasional IV drug use, recently admitted on 07/19/2021 for bilateral lower extremity cellulitis, signing out AMA the following day, but with negative blood cultures at that time, who presents to the ED with swelling to the dorsal aspect of the right hand for the past 2 days. IR guided aspiration of fluid collection was performed on 5/26, came back with MSSA. Patient had right dorsal metacarpal incision and drainage and irrigation performed on 5/30, culture had no growth. MRI showed osteomyelitis.  Patient has been seen by ID, patient will need antibiotics for at least 2 weeks, potentially 4 to 6 weeks.  Currently on cefazolin.  Patient had history of IV drug abuse, could not be discharged to home with IV line.  Patient has to finish antibiotic in the hospital.  Assessment and Plan:  Right hand abscess secondary to MSSA. 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   Hepatitis C. Follow-up with GI as outpatient.  Hypokalemia. Repleted.  Alcohol use disorder,  tobacco abuse. History of IV drug abuse. No alcohol withdrawal.  Advised to quit     Subjective:  Doing well, pain under control. No nausea vomiting.  Physical Exam: Vitals:   08/19/21 2040 08/20/21 0617 08/20/21 0800 08/20/21 1125  BP: (!) 142/88 113/75 120/69 120/75  Pulse: (!) 55 (!) 48 (!) 53 71  Resp: 17 17 16 15   Temp: 98 F (36.7 C) 97.8 F (36.6 C) 97.7 F (36.5 C) 97.7 F (36.5 C)  TempSrc:  Oral    SpO2: 100%  97% 96%  Weight:      Height:       General exam: Appears calm and comfortable   Respiratory system: Clear to auscultation. Respiratory effort normal. Cardiovascular system: S1 & S2 heard, RRR. No JVD, murmurs, rubs, gallops or clicks. No pedal edema. Gastrointestinal system: Abdomen is nondistended, soft and nontender. No organomegaly or masses felt. Normal bowel sounds heard. Central nervous system: Alert and oriented. No focal neurological deficits. Extremities: Symmetric 5 x 5 power. Skin: No rashes, lesions or ulcers Psychiatry: Judgement and insight appear normal. Mood & affect appropriate.   Data Reviewed:  Lab results reviewed  Family Communication: Girlfriend at bedside  Disposition: Status is: Inpatient Remains inpatient appropriate because: Unsafe discharge.  Planned Discharge Destination: Home    Time spent: 20 minutes  Author: Sharen Hones, MD 08/20/2021 2:03 PM  For on call review [email or web address].

## 2021-08-21 NOTE — Progress Notes (Signed)
ID Patient is doing better Says the Penrose drain had fallen out Pain much better   On examination awake and alert Patient Vitals for the past 24 hrs:  BP Temp Temp src Pulse Resp SpO2  08/21/21 0757 134/87 98 F (36.7 C) Oral 65 16 100 %  08/21/21 0456 134/85 97.6 F (36.4 C) -- 70 16 99 %  08/20/21 2018 132/78 97.9 F (36.6 C) -- 74 18 98 %  08/20/21 1545 (!) 128/92 98.1 F (36.7 C) -- 79 15 98 %  08/20/21 1125 120/75 97.7 F (36.5 C) -- 71 15 96 %  Right hand dressing  Chest bilateral air entry Heart sound S1-S2 CNS nonfocal   Labs    Latest Ref Rng & Units 08/20/2021    4:54 AM 08/16/2021    3:13 AM 08/15/2021    3:30 AM  CBC  WBC 4.0 - 10.5 K/uL 4.2   3.2   4.7    Hemoglobin 13.0 - 17.0 g/dL 35.7   01.7   79.3    Hematocrit 39.0 - 52.0 % 39.6   45.1   47.2    Platelets 150 - 400 K/uL 282   274   252          Latest Ref Rng & Units 08/20/2021    4:54 AM 08/16/2021    3:13 AM 08/15/2021    3:30 AM  CMP  Glucose 70 - 99 mg/dL 903   009     BUN 6 - 20 mg/dL 12   8     Creatinine 2.33 - 1.24 mg/dL 0.07   6.22   6.33    Sodium 135 - 145 mmol/L 138   142     Potassium 3.5 - 5.1 mmol/L 3.3   3.6     Chloride 98 - 111 mmol/L 106   107     CO2 22 - 32 mmol/L 26   24     Calcium 8.9 - 10.3 mg/dL 8.4   8.8        Micro Right hand abscess culture Staph aureus  Impression/recommendation  Staphylococcus aureus infection of the dorsum of the hand.  Concerning for involvement of the carpal bones with question of septic arthritis and osteomyelitis. Patient is currently on IV cefazolin Would recommend a minimum of 2 weeks of IV from the time of incision and drainage.Until 08/29/2021 after which we can consider p.o.    History of IV drug use.  Not a candidate for PICC on discharge  Hepatitis C.  Treatment as outpatient Discussed the management with the patient .

## 2021-08-21 NOTE — Progress Notes (Signed)
  Subjective:  Patient reports pain as well controlled and overall pain is improving.  He states the drain fell out during a dressing change over the weekend.  Objective:   VITALS:   Vitals:   08/20/21 2018 08/21/21 0456 08/21/21 0757 08/21/21 1130  BP: 132/78 134/85 134/87 130/61  Pulse: 74 70 65 62  Resp: 18 16 16 16   Temp: 97.9 F (36.6 C) 97.6 F (36.4 C) 98 F (36.7 C) 97.6 F (36.4 C)  TempSrc:   Oral Oral  SpO2: 98% 99% 100% 98%  Weight:      Height:        PHYSICAL EXAM:  Right wrist Incision c/d/i,  No notable erythema or cellulitis, no tenderness to palpation Right hand is NVI  LABS  No results found for this or any previous visit (from the past 24 hour(s)).   No results found.  Assessment/Plan: 6 Days Post-Op  Status post right wrist dorsal midcarpal I&D (08/15/21). - Continue abx treatment per ID (recommend IV cefazolin x 2 weeks per clinical improvement) - Dressing change as needed - Follow-up in clinic in approximately 10 days if discharged   Renee Harder , MD 08/21/2021, 1:08 PM

## 2021-08-21 NOTE — Progress Notes (Signed)
  Progress Note   Patient: Douglas Collins [id number]:[id number] DOB: May 27, 1985 DOA: 08/10/2021     10 DOS: the patient was seen and examined on 08/21/2021   Brief hospital course: Douglas Collins is a 36 y.o. male with medical history significant for Hepatitis C, tobacco use disorder, alcohol use disorder drinking 4 cans of beer daily, and occasional IV drug use, recently admitted on 07/19/2021 for bilateral lower extremity cellulitis, signing out AMA the following day, but with negative blood cultures at that time, who presents to the ED with swelling to the dorsal aspect of the right hand for the past 2 days. IR guided aspiration of fluid collection was performed on 5/26, came back with MSSA. Patient had right dorsal metacarpal incision and drainage and irrigation performed on 5/30, culture had no growth. MRI showed osteomyelitis.  Patient has been seen by ID, patient will need antibiotics for at least 2 weeks, potentially 4 to 6 weeks.  Currently on cefazolin.  Patient had history of IV drug abuse, could not be discharged to home with IV line.  Patient has to finish antibiotic in the hospital.  Assessment and Plan: Right hand abscess secondary to MSSA. 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 Patient is also followed by orthopedics.  Hepatitis C. Outpatient follow-up.   Hypokalemia. Continue to follow.  Alcohol use disorder,  tobacco abuse. History of IV drug abuse. Advised to quit.      Subjective:  Patient doing well, hand pain under control.  Physical Exam: Vitals:   08/20/21 2018 08/21/21 0456 08/21/21 0757 08/21/21 1130  BP: 132/78 134/85 134/87 130/61  Pulse: 74 70 65 62  Resp: 18 16 16 16   Temp: 97.9 F (36.6 C) 97.6 F (36.4 C) 98 F (36.7 C) 97.6 F (36.4 C)  TempSrc:   Oral Oral  SpO2: 98% 99% 100% 98%  Weight:      Height:       General exam: Appears calm and comfortable   Respiratory system: Clear to auscultation. Respiratory effort normal. Cardiovascular system: S1 & S2 heard, RRR. No JVD, murmurs, rubs, gallops or clicks. No pedal edema. Gastrointestinal system: Abdomen is nondistended, soft and nontender. No organomegaly or masses felt. Normal bowel sounds heard. Central nervous system: Alert and oriented. No focal neurological deficits. Extremities: Symmetric 5 x 5 power. Skin: No rashes, lesions or ulcers Psychiatry: Judgement and insight appear normal. Mood & affect appropriate.   Data Reviewed:  No new  Family Communication: Girlfriend at bedside.  Disposition: Status is: Inpatient Remains inpatient appropriate because: Unsafe discharge  Planned Discharge Destination: Home    Time spent: 25 minutes  Author: , MD 08/21/2021 12:49 PM  For on call review [email or web address].

## 2021-08-21 NOTE — Plan of Care (Signed)
  Problem: Clinical Measurements: Goal: Ability to avoid or minimize complications of infection will improve Outcome: Progressing   Problem: Skin Integrity: Goal: Skin integrity will improve Outcome: Progressing   Problem: Education: Goal: Knowledge of General Education information will improve Description: Including pain rating scale, medication(s)/side effects and non-pharmacologic comfort measures Outcome: Progressing   Problem: Health Behavior/Discharge Planning: Goal: Ability to manage health-related needs will improve Outcome: Progressing   Problem: Clinical Measurements: Goal: Ability to maintain clinical measurements within normal limits will improve Outcome: Progressing   Problem: Clinical Measurements: Goal: Diagnostic test results will improve Outcome: Progressing   Problem: Clinical Measurements: Goal: Respiratory complications will improve Outcome: Progressing   Problem: Clinical Measurements: Goal: Cardiovascular complication will be avoided Outcome: Progressing   Problem: Activity: Goal: Risk for activity intolerance will decrease Outcome: Progressing   Problem: Coping: Goal: Level of anxiety will decrease Outcome: Progressing   Problem: Elimination: Goal: Will not experience complications related to bowel motility Outcome: Progressing   Problem: Pain Managment: Goal: General experience of comfort will improve Outcome: Progressing   Problem: Safety: Goal: Ability to remain free from injury will improve Outcome: Progressing

## 2021-08-22 DIAGNOSIS — E876 Hypokalemia: Secondary | ICD-10-CM

## 2021-08-22 NOTE — Plan of Care (Signed)
  Problem: Clinical Measurements: Goal: Ability to avoid or minimize complications of infection will improve Outcome: Progressing   Problem: Skin Integrity: Goal: Skin integrity will improve Outcome: Progressing   Problem: Education: Goal: Knowledge of General Education information will improve Description: Including pain rating scale, medication(s)/side effects and non-pharmacologic comfort measures Outcome: Progressing   Problem: Health Behavior/Discharge Planning: Goal: Ability to manage health-related needs will improve Outcome: Progressing   Problem: Clinical Measurements: Goal: Ability to maintain clinical measurements within normal limits will improve Outcome: Progressing   Problem: Clinical Measurements: Goal: Cardiovascular complication will be avoided Outcome: Progressing   Problem: Activity: Goal: Risk for activity intolerance will decrease Outcome: Progressing   Problem: Nutrition: Goal: Adequate nutrition will be maintained Outcome: Progressing

## 2021-08-22 NOTE — Progress Notes (Addendum)
  Progress Note   Patient: Douglas Collins [id number] DOB: Sep 20, 1985 DOA: 08/10/2021     11 DOS: the patient was seen and examined on 08/22/2021   Brief hospital course: Douglas Collins is a 36 y.o. male with medical history significant for Hepatitis C, tobacco use disorder, alcohol use disorder drinking 4 cans of beer daily, and occasional IV drug use, recently admitted on 07/19/2021 for bilateral lower extremity cellulitis, signing out AMA the following day, but with negative blood cultures at that time, who presents to the ED with swelling to the dorsal aspect of the right hand for the past 2 days. IR guided aspiration of fluid collection was performed on 5/26, came back with MSSA. Patient had right dorsal metacarpal incision and drainage and irrigation performed on 5/30, culture had no growth. MRI showed osteomyelitis.  Patient has been seen by ID, patient will need antibiotics for at least 2 weeks, potentially 4 to 6 weeks.  Currently on cefazolin.  Patient had history of IV drug abuse, could not be discharged to home with IV line.  Patient has to finish antibiotic in the hospital.  Assessment and Plan: Right hand abscess secondary to MSSA. 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 Condition stable.   Hepatitis C. Outpatient follow-up.   Hypokalemia. Recheck labs tomorrow.  Alcohol use disorder,  tobacco abuse. History of IV drug abuse. Conditions are stable.        Subjective:  Patient doing well today, no fever or chills.  No nausea vomiting.  Physical Exam: Vitals:   08/21/21 1511 08/21/21 2029 08/22/21 0554 08/22/21 0814  BP: 138/86 139/85 115/78 111/70  Pulse: 78 (!) 108 82 86  Resp: 16 20 20 16   Temp: 98.5 F (36.9 C) 97.9 F (36.6 C) 98 F (36.7 C) 97.9 F (36.6 C)  TempSrc:      SpO2: 100% 97% 100% 98%  Weight:      Height:       General exam: Appears calm and  comfortable  Respiratory system: Clear to auscultation. Respiratory effort normal. Cardiovascular system: S1 & S2 heard, RRR. No JVD, murmurs, rubs, gallops or clicks. No pedal edema. Gastrointestinal system: Abdomen is nondistended, soft and nontender. No organomegaly or masses felt. Normal bowel sounds heard. Central nervous system: Alert and oriented. No focal neurological deficits. Extremities: Symmetric 5 x 5 power. Skin: No rashes, lesions or ulcers Psychiatry: Judgement and insight appear normal. Mood & affect appropriate.   Data Reviewed:  No new lab results.  Family Communication:   Disposition: Status is: Inpatient Remains inpatient appropriate because: Unsafe discharge.  Planned Discharge Destination: Home    Time spent: 20 minutes  Author: Sharen Hones, MD 08/22/2021 10:37 AM  For on call review [email or web address].

## 2021-08-22 NOTE — TOC Progression Note (Signed)
Transition of Care [facility]) - Progression Note    Patient Details  Name: Douglas Collins MRN: [id number] Date of Birth: 01-13-1986  [facility]) CM/SW [place], RN Phone Number: 08/22/2021, 10:56 AM  Clinical Narrative:     Met with the patient in the room at the bedside, he has a court date and needed a letter faxed to the court showing him in the hospital the letter was faxed by the bedside nurse both yesterday and today The patient stated he didn't have any other needs at this time, The resources for PCP and SA are in his chart and will be provided for him closer to DC time    Expected Discharge Plan: Home/Self Care Barriers to Discharge: Continued Medical Work up  Expected Discharge Plan and Services Expected Discharge Plan: Home/Self Care                                               Social Determinants of Health (SDOH) Interventions    Readmission Risk Interventions     View : No data to display.

## 2021-08-23 LAB — CBC
HCT: 44.2 % (ref 39.0–52.0)
Hemoglobin: 14.7 g/dL (ref 13.0–17.0)
MCH: 30.1 pg (ref 26.0–34.0)
MCHC: 33.3 g/dL (ref 30.0–36.0)
MCV: 90.6 fL (ref 80.0–100.0)
Platelets: 370 10*3/uL (ref 150–400)
RBC: 4.88 MIL/uL (ref 4.22–5.81)
RDW: 14.7 % (ref 11.5–15.5)
WBC: 6.3 10*3/uL (ref 4.0–10.5)
nRBC: 0 % (ref 0.0–0.2)

## 2021-08-23 LAB — BASIC METABOLIC PANEL
Anion gap: 6 (ref 5–15)
BUN: 12 mg/dL (ref 6–20)
CO2: 30 mmol/L (ref 22–32)
Calcium: 9.2 mg/dL (ref 8.9–10.3)
Chloride: 103 mmol/L (ref 98–111)
Creatinine, Ser: 0.52 mg/dL — ABNORMAL LOW (ref 0.61–1.24)
GFR, Estimated: >60 mL/min (ref >60)
Glucose, Bld: 104 mg/dL — ABNORMAL HIGH (ref 70–99)
Potassium: 4.1 mmol/L (ref 3.5–5.1)
Sodium: 139 mmol/L (ref 135–145)

## 2021-08-23 LAB — MAGNESIUM: Magnesium: 2.2 mg/dL (ref 1.7–2.4)

## 2021-08-23 MED ORDER — ENOXAPARIN SODIUM 40 MG/0.4ML IJ SOSY
40.0000 mg | PREFILLED_SYRINGE | INTRAMUSCULAR | Status: DC
  Administered 2021-08-23: 40 mg via SUBCUTANEOUS
  Filled 2021-08-23 (×3): qty 0.4

## 2021-08-23 NOTE — Plan of Care (Signed)

## 2021-08-23 NOTE — Progress Notes (Signed)
PROGRESS NOTE    Douglas Collins  JGG:[id number] DOB: [birth year] DOA: 08/10/2021 PCP: Oneita Hurt, No    Brief Narrative:  Douglas Collins is a 36 y.o. male with medical history significant for Hepatitis C, tobacco use disorder, alcohol use disorder drinking 4 cans of beer daily, and occasional IV drug use, recently admitted on 07/19/2021 for bilateral lower extremity cellulitis, signing out AMA the following day, but with negative blood cultures at that time, who presents to the ED with swelling to the dorsal aspect of the right hand for the past 2 days. IR guided aspiration of fluid collection was performed on 5/26, came back with MSSA. Patient had right dorsal metacarpal incision and drainage and irrigation performed on 5/30, culture had no growth. MRI showed osteomyelitis.  Patient has been seen by ID, patient will need antibiotics for at least 2 weeks, potentially 4 to 6 weeks.  Currently on cefazolin.  Patient had history of IV drug abuse, could not be discharged to home with IV line.  Patient has to finish antibiotic in the hospital.   6/7 no complaints   Consultants:  ID  Procedures:   Antimicrobials:   cefazolin   Subjective: No sob, or cp.   Objective: Vitals:   08/23/21 0817 08/23/21 1208 08/23/21 1532 08/23/21 1534  BP: 134/78 (!) 138/97 121/74 121/74  Pulse: 91 90 82 82  Resp: 16 16 16 16   Temp: 98.4 F (36.9 C) 98 F (36.7 C) 98 F (36.7 C) 98 F (36.7 C)  TempSrc:   Oral   SpO2: 98% 100% 99% 99%  Weight:      Height:       No intake or output data in the 24 hours ending 08/23/21 1647 Filed Weights   08/10/21 2302 08/15/21 1631  Weight: 72.6 kg 72.6 kg    Examination: Calm, NAD Cta no w/r Reg s1/s2 no gallop Soft benign +bs No edema Aaoxox3  Mood and affect appropriate in current setting     Data Reviewed: I have personally reviewed following labs and imaging studies  CBC: Recent Labs  Lab 08/20/21 0454 08/23/21 0508  WBC 4.2 6.3   HGB 13.3 14.7  HCT 39.6 44.2  MCV 89.4 90.6  PLT 282 370   Basic Metabolic Panel: Recent Labs  Lab 08/20/21 0454 08/23/21 0508  NA 138 139  K 3.3* 4.1  CL 106 103  CO2 26 30  GLUCOSE 104* 104*  BUN 12 12  CREATININE 0.48* 0.52*  CALCIUM 8.4* 9.2  MG 2.0 2.2   GFR: Estimated Creatinine Clearance: 129.8 mL/min (A) (by C-G formula based on SCr of 0.52 mg/dL (L)). Liver Function Tests: No results for input(s): AST, ALT, ALKPHOS, BILITOT, PROT, ALBUMIN in the last 168 hours. No results for input(s): LIPASE, AMYLASE in the last 168 hours. No results for input(s): AMMONIA in the last 168 hours. Coagulation Profile: No results for input(s): INR, PROTIME in the last 168 hours. Cardiac Enzymes: No results for input(s): CKTOTAL, CKMB, CKMBINDEX, TROPONINI in the last 168 hours. BNP (last 3 results) No results for input(s): PROBNP in the last 8760 hours. HbA1C: No results for input(s): HGBA1C in the last 72 hours. CBG: No results for input(s): GLUCAP in the last 168 hours. Lipid Profile: No results for input(s): CHOL, HDL, LDLCALC, TRIG, CHOLHDL, LDLDIRECT in the last 72 hours. Thyroid Function Tests: No results for input(s): TSH, T4TOTAL, FREET4, T3FREE, THYROIDAB in the last 72 hours. Anemia Panel: No results for input(s): VITAMINB12, FOLATE, FERRITIN, TIBC, IRON,  RETICCTPCT in the last 72 hours. Sepsis Labs: No results for input(s): PROCALCITON, LATICACIDVEN in the last 168 hours.  Recent Results (from the past 240 hour(s))  Aerobic/Anaerobic Culture w Gram Stain (surgical/deep wound)     Status: None   Collection Time: 08/15/21  5:54 PM   Specimen: PATH Other  Result Value Ref Range Status   Specimen Description   Final    HAND RIGHT INFECTION Performed at [facility], [address] [place], [place] [address]    Special Requests   Final    NONE Performed at [facility], [address] [place], [place] [address]    Gram Stain   Final    FEW  WBC PRESENT,BOTH PMN AND MONONUCLEAR NO ORGANISMS SEEN    Culture   Final    No growth aerobically or anaerobically. Performed at [facility], [address], [place], [place] [address]    Report Status 08/20/2021 FINAL  Final         Radiology Studies: No results found.      Scheduled Meds:  folic acid  1 mg Oral Daily   multivitamin with minerals  1 tablet Oral Daily   thiamine  100 mg Oral Daily   Or   thiamine  100 mg Intravenous Daily   Continuous Infusions:   ceFAZolin (ANCEF) IV 2 g (08/23/21 1402)    Assessment & Plan:   Principal Problem:   Abscess of dorsum of hand, right Active Problems:   Hepatitis C   Alcohol use disorder   Tobacco use disorder   History of intravenous drug abuse (HCC)   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)   Hypokalemia   Right hand abscess secondary to MSSA. Right hand osteomyelitis secondary to MSSA Continue cefazolin until 6/13 then likely can be transition to oral  Due to IV drug use cannot place on PICC      Hepatitis C. Outpatient follow-up   Hypokalemia. Stable  Alcohol use disorder,  tobacco abuse. History of IV drug abuse. No withdrawal Was counseled on abstinence   DVT prophylaxis: lovenox Code Status:full Family Communication: girlfriend in bed with pt Disposition Plan:  Status is: Inpatient Remains inpatient appropriate because: iv treatment        LOS: 12 days   Time spent: 35 min    Douglas Ito, MD [facility] Pager [phone]  If 7PM-7AM, please contact night-coverage 08/23/2021, 4:47 PM

## 2021-08-23 NOTE — Progress Notes (Signed)
PHARMACIST - PHYSICIAN COMMUNICATION  CONCERNING:  Enoxaparin (Lovenox) for DVT Prophylaxis    RECOMMENDATION:   Filed Weights   08/10/21 2302 08/15/21 1631  Weight: 72.6 kg (160 lb) 72.6 kg (160 lb 0.9 oz)    Body mass index is 22.32 kg/m.  Estimated Creatinine Clearance: 129.8 mL/min (A) (by C-G formula based on SCr of 0.52 mg/dL (L)).   Patient is candidate for enoxaparin 40mg  every 24 hours based on CrCl >20ml/min and Weight >45kg  DESCRIPTION: Pharmacy has initiated enoxaparin per [facility] policy.  Patient is now receiving enoxaparin 40 mg every 24 hours    [facility], PharmD Clinical Pharmacist  08/23/2021 5:06 PM

## 2021-08-24 NOTE — Plan of Care (Signed)

## 2021-08-24 NOTE — Progress Notes (Signed)
PROGRESS NOTE    Douglas Collins  BXU:[id number] DOB: 1986-01-24 DOA: 08/10/2021 PCP: Oneita Hurt, No    Brief Narrative:  Douglas Collins is a 36 y.o. male with medical history significant for Hepatitis C, tobacco use disorder, alcohol use disorder drinking 4 cans of beer daily, and occasional IV drug use, recently admitted on 07/19/2021 for bilateral lower extremity cellulitis, signing out AMA the following day, but with negative blood cultures at that time, who presents to the ED with swelling to the dorsal aspect of the right hand for the past 2 days. IR guided aspiration of fluid collection was performed on 5/26, came back with MSSA. Patient had right dorsal metacarpal incision and drainage and irrigation performed on 5/30, culture had no growth. MRI showed osteomyelitis.  Patient has been seen by ID, patient will need antibiotics for at least 2 weeks, potentially 4 to 6 weeks.  Currently on cefazolin.  Patient had history of IV drug abuse, could not be discharged to home with IV line.  Patient has to finish antibiotic in the hospital.   6/7 no complaints 6/8 no overnight complaints   Consultants:  ID  Procedures:   Antimicrobials:   cefazolin   Subjective: No shortness of breath or chest pain  Objective: Vitals:   08/23/21 2043 08/23/21 2045 08/24/21 0537 08/24/21 0917  BP: 133/71  131/73 125/86  Pulse: (!) 111 (!) 110 (!) 101 (!) 108  Resp: 17  17   Temp: 98.3 F (36.8 C)  98 F (36.7 C) 98 F (36.7 C)  TempSrc:      SpO2: 97% 98% 100% 100%  Weight:      Height:        Intake/Output Summary (Last 24 hours) at 08/24/2021 1544 Last data filed at 08/24/2021 1026 Gross per 24 hour  Intake 480 ml  Output --  Net 480 ml   Filed Weights   08/10/21 2302 08/15/21 1631  Weight: 72.6 kg 72.6 kg    Examination: Calm, NAD Cta no w/r Reg s1/s2 no gallop Soft benign +bs No edema Aaoxox3  Mood and affect appropriate in current setting     Data Reviewed: I have  personally reviewed following labs and imaging studies  CBC: Recent Labs  Lab 08/20/21 0454 08/23/21 0508  WBC 4.2 6.3  HGB 13.3 14.7  HCT 39.6 44.2  MCV 89.4 90.6  PLT 282 370   Basic Metabolic Panel: Recent Labs  Lab 08/20/21 0454 08/23/21 0508  NA 138 139  K 3.3* 4.1  CL 106 103  CO2 26 30  GLUCOSE 104* 104*  BUN 12 12  CREATININE 0.48* 0.52*  CALCIUM 8.4* 9.2  MG 2.0 2.2   GFR: Estimated Creatinine Clearance: 129.8 mL/min (A) (by C-G formula based on SCr of 0.52 mg/dL (L)). Liver Function Tests: No results for input(s): "AST", "ALT", "ALKPHOS", "BILITOT", "PROT", "ALBUMIN" in the last 168 hours. No results for input(s): "LIPASE", "AMYLASE" in the last 168 hours. No results for input(s): "AMMONIA" in the last 168 hours. Coagulation Profile: No results for input(s): "INR", "PROTIME" in the last 168 hours. Cardiac Enzymes: No results for input(s): "CKTOTAL", "CKMB", "CKMBINDEX", "TROPONINI" in the last 168 hours. BNP (last 3 results) No results for input(s): "PROBNP" in the last 8760 hours. HbA1C: No results for input(s): "HGBA1C" in the last 72 hours. CBG: No results for input(s): "GLUCAP" in the last 168 hours. Lipid Profile: No results for input(s): "CHOL", "HDL", "LDLCALC", "TRIG", "CHOLHDL", "LDLDIRECT" in the last 72 hours. Thyroid Function  Tests: No results for input(s): "TSH", "T4TOTAL", "FREET4", "T3FREE", "THYROIDAB" in the last 72 hours. Anemia Panel: No results for input(s): "VITAMINB12", "FOLATE", "FERRITIN", "TIBC", "IRON", "RETICCTPCT" in the last 72 hours. Sepsis Labs: No results for input(s): "PROCALCITON", "LATICACIDVEN" in the last 168 hours.  Recent Results (from the past 240 hour(s))  Aerobic/Anaerobic Culture w Gram Stain (surgical/deep wound)     Status: None   Collection Time: 08/15/21  5:54 PM   Specimen: PATH Other  Result Value Ref Range Status   Specimen Description   Final    HAND RIGHT INFECTION Performed at [facility] [address] [place], [place] [address]    Special Requests   Final    NONE Performed at [facility], [address] [place], [place] [address]    Gram Stain   Final    FEW WBC PRESENT,BOTH PMN AND MONONUCLEAR NO ORGANISMS SEEN    Culture   Final    No growth aerobically or anaerobically. Performed at [facility], [address], [place], [place] [address]    Report Status 08/20/2021 FINAL  Final         Radiology Studies: No results found.      Scheduled Meds:  enoxaparin (LOVENOX) injection  40 mg Subcutaneous Q24H   folic acid  1 mg Oral Daily   multivitamin with minerals  1 tablet Oral Daily   thiamine  100 mg Oral Daily   Or   thiamine  100 mg Intravenous Daily   Continuous Infusions:   ceFAZolin (ANCEF) IV 2 g (08/24/21 1501)    Assessment & Plan:   Principal Problem:   Abscess of dorsum of hand, right Active Problems:   Hepatitis C   Alcohol use disorder   Tobacco use disorder   History of intravenous drug abuse (HCC)   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)   Hypokalemia   Right hand abscess secondary to MSSA. Right hand osteomyelitis secondary to MSSA Continue cefazolin until 6/13 then likely can be transition to oral  6/8 due to IV drug use, placed on PICC       Hepatitis C. Outpatient follow-up   Hypokalemia.   Alcohol use disorder,  tobacco abuse. History of IV drug abuse. No withdrawal 6/8 was counseled on abstinence during his hospitalization   DVT prophylaxis: lovenox Code Status:full Family Communication: None at bedside Disposition Plan:  Status is: Inpatient Remains inpatient appropriate because: iv treatment        LOS: 13 days   Time spent: 35 min    Lynn Ito, MD [facility] Pager [phone]  If 7PM-7AM, please contact night-coverage 08/24/2021, 3:44 PM

## 2021-08-24 NOTE — Progress Notes (Signed)
ID Patient is doing better    On examination awake and alert Patient Vitals for the past 24 hrs:  BP Temp Pulse Resp SpO2  08/24/21 1619 115/89 97.7 F (36.5 C) 74 -- 100 %  08/24/21 0917 125/86 98 F (36.7 C) (!) 108 -- 100 %  08/24/21 0537 131/73 98 F (36.7 C) (!) 101 17 100 %  08/23/21 2045 -- -- (!) 110 -- 98 %  08/23/21 2043 133/71 98.3 F (36.8 C) (!) 111 17 97 %  Right hand dressing  removed Some swelling Tenderness on palpation   Chest bilateral air entry Heart sound S1-S2 CNS nonfocal   Labs    Latest Ref Rng & Units 08/23/2021    5:08 AM 08/20/2021    4:54 AM 08/16/2021    3:13 AM  CBC  WBC 4.0 - 10.5 K/uL 6.3  4.2  3.2   Hemoglobin 13.0 - 17.0 g/dL 14.7  13.3  14.9   Hematocrit 39.0 - 52.0 % 44.2  39.6  45.1   Platelets 150 - 400 K/uL 370  282  274         Latest Ref Rng & Units 08/23/2021    5:08 AM 08/20/2021    4:54 AM 08/16/2021    3:13 AM  CMP  Glucose 70 - 99 mg/dL 104  104  155   BUN 6 - 20 mg/dL '12  12  8   ' Creatinine 0.61 - 1.24 mg/dL 0.52  0.48  0.66   Sodium 135 - 145 mmol/L 139  138  142   Potassium 3.5 - 5.1 mmol/L 4.1  3.3  3.6   Chloride 98 - 111 mmol/L 103  106  107   CO2 22 - 32 mmol/L '30  26  24   ' Calcium 8.9 - 10.3 mg/dL 9.2  8.4  8.8      Micro Right hand abscess culture Staph aureus  Radiology Xray hand  Healing rt 5th Metacarpal fracture 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 Has underlying old fracture of the 5 th metacarpal.  Concerning for involvement of the carpal bones with question of septic arthritis and osteomyelitis. Patient is currently on IV cefazolin Would recommend a minimum of 2 weeks of IV from the time of incision and drainage.Until 08/29/2021 after which we can consider p.o. keflex 1 gram Q 6 for 4 more weeks  ESR/CRP N  History of IV drug use.  Not a candidate for PICC on discharge  Hepatitis C.   Treatment as outpatient Discussed the management with the patient .

## 2021-08-25 NOTE — Progress Notes (Signed)
PROGRESS NOTE    Douglas Collins  [id number] DOB: [birth year] DOA: 08/10/2021 PCP: Oneita Hurt, No    Brief Narrative:  Douglas Collins is a 36 y.o. male with medical history significant for Hepatitis C, tobacco use disorder, alcohol use disorder drinking 4 cans of beer daily, and occasional IV drug use, recently admitted on 07/19/2021 for bilateral lower extremity cellulitis, signing out AMA the following day, but with negative blood cultures at that time, who presents to the ED with swelling to the dorsal aspect of the right hand for the past 2 days. IR guided aspiration of fluid collection was performed on 5/26, came back with MSSA. Patient had right dorsal metacarpal incision and drainage and irrigation performed on 5/30, culture had no growth. MRI showed osteomyelitis.  Patient has been seen by ID, patient will need antibiotics for at least 2 weeks, potentially 4 to 6 weeks.  Currently on cefazolin.  Patient had history of IV drug abuse, could not be discharged to home with IV line.  Patient has to finish antibiotic in the hospital.   6/7 no complaints 6/8 no overnight complaints 6/9 no overnight issues   Consultants:  ID  Procedures:   Antimicrobials:   cefazolin   Subjective: No shortness of breath or chest pain  Objective: Vitals:   08/24/21 1619 08/24/21 2128 08/25/21 0455 08/25/21 0803  BP: 115/89 116/84 (!) 135/91 124/63  Pulse: 74 87 87 86  Resp:  17 17 18   Temp: 97.7 F (36.5 C) 98.5 F (36.9 C) 98.1 F (36.7 C) 97.7 F (36.5 C)  TempSrc:      SpO2: 100% 100% 100% 96%  Weight:      Height:        Intake/Output Summary (Last 24 hours) at 08/25/2021 0846 Last data filed at 08/24/2021 1849 Gross per 24 hour  Intake 620 ml  Output --  Net 620 ml   Filed Weights   08/10/21 2302 08/15/21 1631  Weight: 72.6 kg 72.6 kg    Examination: Calm, NAD Cta no w/r Reg s1/s2 no gallop Soft benign +bs No edema Aaoxox3  Mood and affect appropriate in current  setting     Data Reviewed: I have personally reviewed following labs and imaging studies  CBC: Recent Labs  Lab 08/20/21 0454 08/23/21 0508  WBC 4.2 6.3  HGB 13.3 14.7  HCT 39.6 44.2  MCV 89.4 90.6  PLT 282 370   Basic Metabolic Panel: Recent Labs  Lab 08/20/21 0454 08/23/21 0508  NA 138 139  K 3.3* 4.1  CL 106 103  CO2 26 30  GLUCOSE 104* 104*  BUN 12 12  CREATININE 0.48* 0.52*  CALCIUM 8.4* 9.2  MG 2.0 2.2   GFR: Estimated Creatinine Clearance: 129.8 mL/min (A) (by C-G formula based on SCr of 0.52 mg/dL (L)). Liver Function Tests: No results for input(s): "AST", "ALT", "ALKPHOS", "BILITOT", "PROT", "ALBUMIN" in the last 168 hours. No results for input(s): "LIPASE", "AMYLASE" in the last 168 hours. No results for input(s): "AMMONIA" in the last 168 hours. Coagulation Profile: No results for input(s): "INR", "PROTIME" in the last 168 hours. Cardiac Enzymes: No results for input(s): "CKTOTAL", "CKMB", "CKMBINDEX", "TROPONINI" in the last 168 hours. BNP (last 3 results) No results for input(s): "PROBNP" in the last 8760 hours. HbA1C: No results for input(s): "HGBA1C" in the last 72 hours. CBG: No results for input(s): "GLUCAP" in the last 168 hours. Lipid Profile: No results for input(s): "CHOL", "HDL", "LDLCALC", "TRIG", "CHOLHDL", "LDLDIRECT" in the last  72 hours. Thyroid Function Tests: No results for input(s): "TSH", "T4TOTAL", "FREET4", "T3FREE", "THYROIDAB" in the last 72 hours. Anemia Panel: No results for input(s): "VITAMINB12", "FOLATE", "FERRITIN", "TIBC", "IRON", "RETICCTPCT" in the last 72 hours. Sepsis Labs: No results for input(s): "PROCALCITON", "LATICACIDVEN" in the last 168 hours.  Recent Results (from the past 240 hour(s))  Aerobic/Anaerobic Culture w Gram Stain (surgical/deep wound)     Status: None   Collection Time: 08/15/21  5:54 PM   Specimen: PATH Other  Result Value Ref Range Status   Specimen Description   Final    HAND RIGHT  INFECTION Performed at [facility], [address] [place], [place] [address]    Special Requests   Final    NONE Performed at [facility], [address] [place], [place] [address]    Gram Stain   Final    FEW WBC PRESENT,BOTH PMN AND MONONUCLEAR NO ORGANISMS SEEN    Culture   Final    No growth aerobically or anaerobically. Performed at [facility], [address], [place], [place] [address]    Report Status 08/20/2021 FINAL  Final         Radiology Studies: No results found.      Scheduled Meds:  enoxaparin (LOVENOX) injection  40 mg Subcutaneous Q24H   folic acid  1 mg Oral Daily   multivitamin with minerals  1 tablet Oral Daily   thiamine  100 mg Oral Daily   Or   thiamine  100 mg Intravenous Daily   Continuous Infusions:   ceFAZolin (ANCEF) IV 2 g (08/25/21 2263)    Assessment & Plan:   Principal Problem:   Abscess of dorsum of hand, right Active Problems:   Hepatitis C   Alcohol use disorder   Tobacco use disorder   History of intravenous drug abuse (HCC)   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)   Hypokalemia   Right hand abscess secondary to MSSA. Right hand osteomyelitis secondary to MSSA Continue cefazolin until 6/13 then likely can be transition to oral  6/9 due to IV drug use cannot place PICC line, needs treatment with IV as inpatient         Hepatitis C. Outpatient follow-up   Hypokalemia. Replaced and stable  Alcohol use disorder,  tobacco abuse. History of IV drug abuse. No withdrawal 6/9 was counseled on abstinence during his hospitalization      DVT prophylaxis: lovenox Code Status:full Family Communication: None at bedside Disposition Plan:  Status is: Inpatient Remains inpatient appropriate because: iv treatment with IV antibiotics as patient has history of IV drug use and unable to place PICC and send him home        LOS: 14 days   Time spent: 35  min    Lynn Ito, MD [facility] Pager [phone]  If 7PM-7AM, please contact night-coverage 08/25/2021, 8:46 AM

## 2021-08-25 NOTE — Plan of Care (Signed)

## 2021-08-26 MED ORDER — HYDROCODONE-ACETAMINOPHEN 5-325 MG PO TABS
1.0000 | ORAL_TABLET | ORAL | Status: DC | PRN
  Administered 2021-08-27: 1 via ORAL
  Filled 2021-08-26: qty 1

## 2021-08-26 NOTE — Progress Notes (Signed)
A consult was placed to the IV Therapist for new iv access;  pt's history noted;  attempted x 2 , using ultrasound, to start a PIV in the left anterior forearm;  scar tissue noted;  unable to flush once in the vein;  pt had a picc line before and is refusing another one; another IV RN to assess tonight on the next shift.    RN aware of no iv access at this time.

## 2021-08-26 NOTE — Progress Notes (Signed)
PROGRESS NOTE    Douglas Collins  TGY:[id number] DOB: [birth year] DOA: 08/10/2021 PCP: Oneita Hurt, No    Brief Narrative:  Douglas Collins is a 36 y.o. male with medical history significant for Hepatitis C, tobacco use disorder, alcohol use disorder drinking 4 cans of beer daily, and occasional IV drug use, recently admitted on 07/19/2021 for bilateral lower extremity cellulitis, signing out AMA the following day, but with negative blood cultures at that time, who presents to the ED with swelling to the dorsal aspect of the right hand for the past 2 days. IR guided aspiration of fluid collection was performed on 5/26, came back with MSSA. Patient had right dorsal metacarpal incision and drainage and irrigation performed on 5/30, culture had no growth. MRI showed osteomyelitis.  Patient has been seen by ID, patient will need antibiotics for at least 2 weeks, potentially 4 to 6 weeks.  Currently on cefazolin.  Patient had history of IV drug abuse, could not be discharged to home with IV line.  Patient has to finish antibiotic in the hospital.   6/7 no complaints 6/8 no overnight complaints 6/9 no overnight issues 6/10 no complaints   Consultants:  ID  Procedures:   Antimicrobials:   cefazolin   Subjective: No shortness of breath or chest pain  Objective: Vitals:   08/25/21 1531 08/25/21 2052 08/26/21 0534 08/26/21 0822  BP: 139/78 123/73 (!) 103/57 118/63  Pulse: 71 (!) 108 77 60  Resp: 17 20 20 17   Temp: 97.9 F (36.6 C) 97.7 F (36.5 C) 97.8 F (36.6 C) 97.8 F (36.6 C)  TempSrc:      SpO2: 100% 97% 97% 99%  Weight:      Height:       No intake or output data in the 24 hours ending 08/26/21 0849  Filed Weights   08/10/21 2302 08/15/21 1631  Weight: 72.6 kg 72.6 kg    Examination: Calm, NAD Cta no w/r Reg s1/s2 no gallop Soft benign +bs LUE stitch in place, no drainge , no LE edema Aaoxox3  Mood and affect appropriate in current setting     Data  Reviewed: I have personally reviewed following labs and imaging studies  CBC: Recent Labs  Lab 08/20/21 0454 08/23/21 0508  WBC 4.2 6.3  HGB 13.3 14.7  HCT 39.6 44.2  MCV 89.4 90.6  PLT 282 370   Basic Metabolic Panel: Recent Labs  Lab 08/20/21 0454 08/23/21 0508  NA 138 139  K 3.3* 4.1  CL 106 103  CO2 26 30  GLUCOSE 104* 104*  BUN 12 12  CREATININE 0.48* 0.52*  CALCIUM 8.4* 9.2  MG 2.0 2.2   GFR: Estimated Creatinine Clearance: 129.8 mL/min (A) (by C-G formula based on SCr of 0.52 mg/dL (L)). Liver Function Tests: No results for input(s): "AST", "ALT", "ALKPHOS", "BILITOT", "PROT", "ALBUMIN" in the last 168 hours. No results for input(s): "LIPASE", "AMYLASE" in the last 168 hours. No results for input(s): "AMMONIA" in the last 168 hours. Coagulation Profile: No results for input(s): "INR", "PROTIME" in the last 168 hours. Cardiac Enzymes: No results for input(s): "CKTOTAL", "CKMB", "CKMBINDEX", "TROPONINI" in the last 168 hours. BNP (last 3 results) No results for input(s): "PROBNP" in the last 8760 hours. HbA1C: No results for input(s): "HGBA1C" in the last 72 hours. CBG: No results for input(s): "GLUCAP" in the last 168 hours. Lipid Profile: No results for input(s): "CHOL", "HDL", "LDLCALC", "TRIG", "CHOLHDL", "LDLDIRECT" in the last 72 hours. Thyroid Function Tests: No results  for input(s): "TSH", "T4TOTAL", "FREET4", "T3FREE", "THYROIDAB" in the last 72 hours. Anemia Panel: No results for input(s): "VITAMINB12", "FOLATE", "FERRITIN", "TIBC", "IRON", "RETICCTPCT" in the last 72 hours. Sepsis Labs: No results for input(s): "PROCALCITON", "LATICACIDVEN" in the last 168 hours.  No results found for this or any previous visit (from the past 240 hour(s)).        Radiology Studies: No results found.      Scheduled Meds:  enoxaparin (LOVENOX) injection  40 mg Subcutaneous Q24H   folic acid  1 mg Oral Daily   multivitamin with minerals  1 tablet Oral  Daily   thiamine  100 mg Oral Daily   Or   thiamine  100 mg Intravenous Daily   Continuous Infusions:   ceFAZolin (ANCEF) IV 2 g (08/26/21 0606)    Assessment & Plan:   Principal Problem:   Abscess of dorsum of hand, right Active Problems:   Hepatitis C   Alcohol use disorder   Tobacco use disorder   History of intravenous drug abuse (HCC)   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)   Hypokalemia   Right hand abscess secondary to MSSA. Right hand osteomyelitis secondary to MSSA Continue cefazolin until 6/13 then likely can be transition to oral  6/10 due to IV drug use cannot place PICC line.  Needs treatment with IV as inpatient  Transition tapering down on oxy.       Hepatitis C. Outpatient follow-up   Hypokalemia. Replaced and stable  Alcohol use disorder,  tobacco abuse. History of IV drug abuse. No withdrawal 6/10 was counseled on abstinence from his hospitalization         DVT prophylaxis: lovenox Code Status:full Family Communication: None at bedside Disposition Plan:  Status is: Inpatient Remains inpatient appropriate because: iv treatment with IV antibiotics as patient has history of IV drug use and unable to place PICC and send him home        LOS: 15 days   Time spent: 35 min    Lynn Ito, MD [facility] Pager [phone]  If 7PM-7AM, please contact night-coverage 08/26/2021, 8:49 AM

## 2021-08-26 NOTE — Plan of Care (Signed)

## 2021-08-27 MED ORDER — HYDROCODONE-ACETAMINOPHEN 5-325 MG PO TABS
1.0000 | ORAL_TABLET | Freq: Four times a day (QID) | ORAL | Status: DC | PRN
  Administered 2021-08-27 – 2021-08-28 (×3): 1 via ORAL
  Filled 2021-08-27 (×3): qty 1

## 2021-08-27 MED ORDER — ACETAMINOPHEN 325 MG PO TABS
650.0000 mg | ORAL_TABLET | Freq: Four times a day (QID) | ORAL | Status: DC | PRN
Start: 2021-08-27 — End: 2021-08-28

## 2021-08-27 MED ORDER — ACETAMINOPHEN 650 MG RE SUPP
650.0000 mg | Freq: Four times a day (QID) | RECTAL | Status: DC | PRN
Start: 2021-08-27 — End: 2021-08-28

## 2021-08-27 NOTE — Progress Notes (Signed)
PROGRESS NOTE    Douglas Collins  [id number] DOB: 10-10-1985 DOA: 08/10/2021 PCP: Merryl Hacker, No    Brief Narrative:  Douglas Collins is a 36 y.o. male with medical history significant for Hepatitis C, tobacco use disorder, alcohol use disorder drinking 4 cans of beer daily, and occasional IV drug use, recently admitted on 07/19/2021 for bilateral lower extremity cellulitis, signing out AMA the following day, but with negative blood cultures at that time, who presents to the ED with swelling to the dorsal aspect of the right hand for the past 2 days. IR guided aspiration of fluid collection was performed on 5/26, came back with MSSA. Patient had right dorsal metacarpal incision and drainage and irrigation performed on 5/30, culture had no growth. MRI showed osteomyelitis.  Patient has been seen by ID, patient will need antibiotics for at least 2 weeks, potentially 4 to 6 weeks.  Currently on cefazolin.  Patient had history of IV drug abuse, could not be discharged to home with IV line.  Patient has to finish antibiotic in the hospital.   6/11 no overnight issues.    Consultants:  ID  Procedures:   Antimicrobials:   cefazolin   Subjective: No sob, cp or dizziness  Objective: Vitals:   08/26/21 1721 08/26/21 2213 08/27/21 0401 08/27/21 0802  BP: 137/82 (!) 143/98 130/76 115/64  Pulse: 70 (!) 107 95 68  Resp: 16 17 17 16   Temp: 97.7 F (36.5 C) 98.7 F (37.1 C) 98.1 F (36.7 C) (!) 97.5 F (36.4 C)  TempSrc:  Oral Oral   SpO2: 99% 100% 100% 98%  Weight:      Height:       No intake or output data in the 24 hours ending 08/27/21 0853  Filed Weights   08/10/21 2302 08/15/21 1631  Weight: 72.6 kg 72.6 kg    Examination: Calm, NAD, sleepy Cta no w/r Reg s1/s2 no gallop Soft benign +bs No edema Grossly intact Mood and affect appropriate in current setting     Data Reviewed: I have personally reviewed following labs and imaging studies  CBC: Recent Labs   Lab 08/23/21 0508  WBC 6.3  HGB 14.7  HCT 44.2  MCV 90.6  PLT 0000000   Basic Metabolic Panel: Recent Labs  Lab 08/23/21 0508  NA 139  K 4.1  CL 103  CO2 30  GLUCOSE 104*  BUN 12  CREATININE 0.52*  CALCIUM 9.2  MG 2.2   GFR: Estimated Creatinine Clearance: 129.8 mL/min (A) (by C-G formula based on SCr of 0.52 mg/dL (L)). Liver Function Tests: No results for input(s): "AST", "ALT", "ALKPHOS", "BILITOT", "PROT", "ALBUMIN" in the last 168 hours. No results for input(s): "LIPASE", "AMYLASE" in the last 168 hours. No results for input(s): "AMMONIA" in the last 168 hours. Coagulation Profile: No results for input(s): "INR", "PROTIME" in the last 168 hours. Cardiac Enzymes: No results for input(s): "CKTOTAL", "CKMB", "CKMBINDEX", "TROPONINI" in the last 168 hours. BNP (last 3 results) No results for input(s): "PROBNP" in the last 8760 hours. HbA1C: No results for input(s): "HGBA1C" in the last 72 hours. CBG: No results for input(s): "GLUCAP" in the last 168 hours. Lipid Profile: No results for input(s): "CHOL", "HDL", "LDLCALC", "TRIG", "CHOLHDL", "LDLDIRECT" in the last 72 hours. Thyroid Function Tests: No results for input(s): "TSH", "T4TOTAL", "FREET4", "T3FREE", "THYROIDAB" in the last 72 hours. Anemia Panel: No results for input(s): "VITAMINB12", "FOLATE", "FERRITIN", "TIBC", "IRON", "RETICCTPCT" in the last 72 hours. Sepsis Labs: No results for input(s): "  PROCALCITON", "LATICACIDVEN" in the last 168 hours.  No results found for this or any previous visit (from the past 240 hour(s)).        Radiology Studies: No results found.      Scheduled Meds:  enoxaparin (LOVENOX) injection  40 mg Subcutaneous [id number]   folic acid  1 mg Oral Daily   multivitamin with minerals  1 tablet Oral Daily   thiamine  100 mg Oral Daily   Or   thiamine  100 mg Intravenous Daily   Continuous Infusions:   ceFAZolin (ANCEF) IV 2 g (08/26/21 2211)    Assessment & Plan:    Principal Problem:   Abscess of dorsum of hand, right Active Problems:   Hepatitis C   Alcohol use disorder   Tobacco use disorder   History of intravenous drug abuse ([facility])   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)   Hypokalemia   Right hand abscess secondary to MSSA. Right hand osteomyelitis secondary to MSSA Continue cefazolin until 6/13 then likely can be transition to oral  6/10 due to IV drug use cannot place PICC line.  Needs treatment with IV as inpatient  6/11 continue to taper off oxycodone Will change to q6hrs prn 1 tab.      Hepatitis C. Outpatient follow-up      Hypokalemia. Replaced and stable  Alcohol use disorder,  tobacco abuse. History of IV drug abuse. No withdrawal 6/11 was counseled on abstinence during this hospitalization          DVT prophylaxis: lovenox Code Status:full Family Communication: None at bedside Disposition Plan:  Status is: Inpatient Remains inpatient appropriate because: iv treatment with IV antibiotics as patient has history of IV drug use and unable to place PICC and send him home        LOS: 16 days   Time spent: 35 min    Nolberto Hanlon, MD [facility] Pager [phone]  If 7PM-7AM, please contact night-coverage 08/27/2021, 8:53 AM

## 2021-08-27 NOTE — Plan of Care (Signed)

## 2021-08-28 ENCOUNTER — Other Ambulatory Visit: Payer: Self-pay

## 2021-08-28 LAB — CBC
HCT: 49.4 % (ref 39.0–52.0)
Hemoglobin: 16.2 g/dL (ref 13.0–17.0)
MCH: 30 pg (ref 26.0–34.0)
MCHC: 32.8 g/dL (ref 30.0–36.0)
MCV: 91.5 fL (ref 80.0–100.0)
Platelets: 349 10*3/uL (ref 150–400)
RBC: 5.4 MIL/uL (ref 4.22–5.81)
RDW: 14.8 % (ref 11.5–15.5)
WBC: 4.8 10*3/uL (ref 4.0–10.5)
nRBC: 0 % (ref 0.0–0.2)

## 2021-08-28 LAB — BASIC METABOLIC PANEL
Anion gap: 6 (ref 5–15)
BUN: 9 mg/dL (ref 6–20)
CO2: 28 mmol/L (ref 22–32)
Calcium: 9 mg/dL (ref 8.9–10.3)
Chloride: 106 mmol/L (ref 98–111)
Creatinine, Ser: 0.7 mg/dL (ref 0.61–1.24)
GFR, Estimated: >60 mL/min (ref >60)
Glucose, Bld: 91 mg/dL (ref 70–99)
Potassium: 3.8 mmol/L (ref 3.5–5.1)
Sodium: 140 mmol/L (ref 135–145)

## 2021-08-28 MED ORDER — ACETAMINOPHEN 650 MG RE SUPP
650.0000 mg | Freq: Four times a day (QID) | RECTAL | Status: DC | PRN
Start: 2021-08-28 — End: 2021-08-29

## 2021-08-28 MED ORDER — CEPHALEXIN 500 MG PO CAPS
1000.0000 mg | ORAL_CAPSULE | Freq: Four times a day (QID) | ORAL | 0 refills | Status: AC
  Filled 2021-08-28: qty 224, 28d supply, fill #0

## 2021-08-28 MED ORDER — ACETAMINOPHEN 325 MG PO TABS: 650.0000 mg | ORAL_TABLET | Freq: Four times a day (QID) | ORAL | Status: DC | PRN

## 2021-08-28 NOTE — Progress Notes (Signed)
PROGRESS NOTE    Douglas Collins  TLX:[id number] DOB: 01-Nov-1985 DOA: 08/10/2021 PCP: Oneita Hurt, No    Brief Narrative:  Douglas Collins is a 36 y.o. male with medical history significant for Hepatitis C, tobacco use disorder, alcohol use disorder drinking 4 cans of beer daily, and occasional IV drug use, recently admitted on 07/19/2021 for bilateral lower extremity cellulitis, signing out AMA the following day, but with negative blood cultures at that time, who presents to the ED with swelling to the dorsal aspect of the right hand for the past 2 days. IR guided aspiration of fluid collection was performed on 5/26, came back with MSSA. Patient had right dorsal metacarpal incision and drainage and irrigation performed on 5/30, culture had no growth. MRI showed osteomyelitis.  Patient has been seen by ID, patient will need antibiotics for at least 2 weeks, potentially 4 to 6 weeks.  Currently on cefazolin.  Patient had history of IV drug abuse, could not be discharged to home with IV line.  Patient has to finish antibiotic in the hospital.   6/12 no issues.    Consultants:  ID  Procedures:   Antimicrobials:   cefazolin   Subjective: No sob, cp or dizziness  Objective: Vitals:   08/27/21 1509 08/27/21 1954 08/28/21 0349 08/28/21 0810  BP: (!) 137/50 138/83 123/67 106/62  Pulse: 60 95 84 (!) 54  Resp: 18 18 16 16   Temp: 98 F (36.7 C) 99 F (37.2 C) 98.2 F (36.8 C) 97.6 F (36.4 C)  TempSrc:  Oral Oral   SpO2: 100% 100% 97% 98%  Weight:      Height:       No intake or output data in the 24 hours ending 08/28/21 0852  Filed Weights   08/10/21 2302 08/15/21 1631  Weight: 72.6 kg 72.6 kg    Examination: Calm, NAD Cta no w/r Reg s1/s2 no gallop Soft benign +bs No edema Aaoxox3  Mood and affect appropriate in current setting     Data Reviewed: I have personally reviewed following labs and imaging studies  CBC: Recent Labs  Lab 08/23/21 0508  WBC 6.3   HGB 14.7  HCT 44.2  MCV 90.6  PLT 370   Basic Metabolic Panel: Recent Labs  Lab 08/23/21 0508  NA 139  K 4.1  CL 103  CO2 30  GLUCOSE 104*  BUN 12  CREATININE 0.52*  CALCIUM 9.2  MG 2.2   GFR: Estimated Creatinine Clearance: 129.8 mL/min (A) (by C-G formula based on SCr of 0.52 mg/dL (L)). Liver Function Tests: No results for input(s): "AST", "ALT", "ALKPHOS", "BILITOT", "PROT", "ALBUMIN" in the last 168 hours. No results for input(s): "LIPASE", "AMYLASE" in the last 168 hours. No results for input(s): "AMMONIA" in the last 168 hours. Coagulation Profile: No results for input(s): "INR", "PROTIME" in the last 168 hours. Cardiac Enzymes: No results for input(s): "CKTOTAL", "CKMB", "CKMBINDEX", "TROPONINI" in the last 168 hours. BNP (last 3 results) No results for input(s): "PROBNP" in the last 8760 hours. HbA1C: No results for input(s): "HGBA1C" in the last 72 hours. CBG: No results for input(s): "GLUCAP" in the last 168 hours. Lipid Profile: No results for input(s): "CHOL", "HDL", "LDLCALC", "TRIG", "CHOLHDL", "LDLDIRECT" in the last 72 hours. Thyroid Function Tests: No results for input(s): "TSH", "T4TOTAL", "FREET4", "T3FREE", "THYROIDAB" in the last 72 hours. Anemia Panel: No results for input(s): "VITAMINB12", "FOLATE", "FERRITIN", "TIBC", "IRON", "RETICCTPCT" in the last 72 hours. Sepsis Labs: No results for input(s): "PROCALCITON", "LATICACIDVEN" in  the last 168 hours.  No results found for this or any previous visit (from the past 240 hour(s)).        Radiology Studies: No results found.      Scheduled Meds:  enoxaparin (LOVENOX) injection  40 mg Subcutaneous Q24H   folic acid  1 mg Oral Daily   multivitamin with minerals  1 tablet Oral Daily   thiamine  100 mg Oral Daily   Or   thiamine  100 mg Intravenous Daily   Continuous Infusions:   ceFAZolin (ANCEF) IV 2 g (08/28/21 0042)    Assessment & Plan:   Principal Problem:   Abscess of  dorsum of hand, right Active Problems:   Hepatitis C   Alcohol use disorder   Tobacco use disorder   History of intravenous drug abuse (HCC)   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)   Hypokalemia   Right hand abscess secondary to MSSA. Right hand osteomyelitis secondary to MSSA Continue cefazolin until 6/13 then likely can be transition to oral  6/10 due to IV drug use cannot place PICC line.  Needs treatment with IV as inpatient  6/12 continue iv abx , last dose 6/13. Then po. Dc oxycodone       Hepatitis C. Follow-up as outpatient     Hypokalemia. Replaced and stable  Alcohol use disorder,  tobacco abuse. History of IV drug abuse. No withdrawal 6/12 was counseled on abstinence during this hospitalization           DVT prophylaxis: lovenox Code Status:full Family Communication: None at bedside Disposition Plan:  Status is: Inpatient Remains inpatient appropriate because: iv treatment with IV antibiotics as patient has history of IV drug use and unable to place PICC and send him home        LOS: 17 days   Time spent: 35 min    Lynn Ito, MD [facility] Pager [phone]  If 7PM-7AM, please contact night-coverage 08/28/2021, 8:52 AM

## 2021-08-28 NOTE — Plan of Care (Signed)
  Problem: Clinical Measurements: Goal: Ability to avoid or minimize complications of infection will improve 08/28/2021 1411 by Bing Quarry, LPN Outcome: Progressing 08/28/2021 1411 by Bing Quarry, LPN Outcome: Progressing   Problem: Skin Integrity: Goal: Skin integrity will improve 08/28/2021 1411 by Bing Quarry, LPN Outcome: Progressing 08/28/2021 1411 by Bing Quarry, LPN Outcome: Progressing   Problem: Education: Goal: Knowledge of General Education information will improve Description: Including pain rating scale, medication(s)/side effects and non-pharmacologic comfort measures 08/28/2021 1411 by Bing Quarry, LPN Outcome: Progressing 08/28/2021 1411 by Bing Quarry, LPN Outcome: Progressing   Problem: Health Behavior/Discharge Planning: Goal: Ability to manage health-related needs will improve 08/28/2021 1411 by Bing Quarry, LPN Outcome: Progressing 08/28/2021 1411 by Bing Quarry, LPN Outcome: Progressing   Problem: Clinical Measurements: Goal: Ability to maintain clinical measurements within normal limits will improve 08/28/2021 1411 by Bing Quarry, LPN Outcome: Progressing 08/28/2021 1411 by Bing Quarry, LPN Outcome: Progressing Goal: Will remain free from infection 08/28/2021 1411 by Bing Quarry, LPN Outcome: Progressing 08/28/2021 1411 by Bing Quarry, LPN Outcome: Progressing Goal: Diagnostic test results will improve 08/28/2021 1411 by Bing Quarry, LPN Outcome: Progressing 08/28/2021 1411 by Bing Quarry, LPN Outcome: Progressing Goal: Respiratory complications will improve 08/28/2021 1411 by Bing Quarry, LPN Outcome: Progressing 08/28/2021 1411 by Bing Quarry, LPN Outcome: Progressing Goal: Cardiovascular complication will be avoided 08/28/2021 1411 by Bing Quarry, LPN Outcome: Progressing 08/28/2021 1411 by Bing Quarry, LPN Outcome: Progressing   Problem: Activity: Goal: Risk for activity intolerance will  decrease 08/28/2021 1411 by Bing Quarry, LPN Outcome: Progressing 08/28/2021 1411 by Bing Quarry, LPN Outcome: Progressing   Problem: Nutrition: Goal: Adequate nutrition will be maintained 08/28/2021 1411 by Bing Quarry, LPN Outcome: Progressing 08/28/2021 1411 by Bing Quarry, LPN Outcome: Progressing   Problem: Coping: Goal: Level of anxiety will decrease 08/28/2021 1411 by Bing Quarry, LPN Outcome: Progressing 08/28/2021 1411 by Bing Quarry, LPN Outcome: Progressing   Problem: Elimination: Goal: Will not experience complications related to bowel motility 08/28/2021 1411 by Bing Quarry, LPN Outcome: Progressing 08/28/2021 1411 by Bing Quarry, LPN Outcome: Progressing Goal: Will not experience complications related to urinary retention 08/28/2021 1411 by Bing Quarry, LPN Outcome: Progressing 08/28/2021 1411 by Bing Quarry, LPN Outcome: Progressing   Problem: Pain Managment: Goal: General experience of comfort will improve 08/28/2021 1411 by Bing Quarry, LPN Outcome: Progressing 08/28/2021 1411 by Bing Quarry, LPN Outcome: Progressing   Problem: Safety: Goal: Ability to remain free from injury will improve 08/28/2021 1411 by Bing Quarry, LPN Outcome: Progressing 08/28/2021 1411 by Bing Quarry, LPN Outcome: Progressing   Problem: Skin Integrity: Goal: Risk for impaired skin integrity will decrease 08/28/2021 1411 by Bing Quarry, LPN Outcome: Progressing 08/28/2021 1411 by Bing Quarry, LPN Outcome: Progressing

## 2021-08-28 NOTE — Progress Notes (Signed)
ID Patient is doing better    On examination awake and alert Patient Vitals for the past 24 hrs:  BP Temp Temp src Pulse Resp SpO2  08/28/21 2003 136/86 99.2 F (37.3 C) -- 80 17 99 %  08/28/21 0810 106/62 97.6 F (36.4 C) -- (!) 54 16 98 %  08/28/21 0349 123/67 98.2 F (36.8 C) Oral 84 16 97 %  Right hand dressing  removed Some swelling Tenderness on palpation Chest bilateral air entry Heart sound S1-S2 CNS nonfocal   Labs    Latest Ref Rng & Units 08/28/2021   10:04 AM 08/23/2021    5:08 AM 08/20/2021    4:54 AM  CBC  WBC 4.0 - 10.5 K/uL 4.8  6.3  4.2   Hemoglobin 13.0 - 17.0 g/dL 16.2  14.7  13.3   Hematocrit 39.0 - 52.0 % 49.4  44.2  39.6   Platelets 150 - 400 K/uL 349  370  282         Latest Ref Rng & Units 08/28/2021   10:04 AM 08/23/2021    5:08 AM 08/20/2021    4:54 AM  CMP  Glucose 70 - 99 mg/dL 91  104  104   BUN 6 - 20 mg/dL _0 Creatinine 0.61 - 1.24 mg/dL 0.70  0.52  0.48   Sodium 135 - 145 mmol/L 140  139  138   Potassium 3.5 - 5.1 mmol/L 3.8  4.1  3.3   Chloride 98 - 111 mmol/L 106  103  106   CO2 22 - 32 mmol/L _1 Calcium 8.9 - 10.3 mg/dL 9.0  9.2  8.4      Micro Right hand abscess culture Staph aureus  Radiology Xray hand  Healing rt 5th Metacarpal fracture 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 Has underlying old fracture of the 5 th metacarpal.  Concerning for involvement of the carpal bones with question of septic arthritis and osteomyelitis. Patient is currently on IV cefazolin  will complete 2 weeks tomorrow- ON  08/29/2021 can switch p.o. keflex 1 gram Q 6 for 4 more weeks  ESR/CRP N  History of IV drug use.  Not a candidate for PICC on discharge  Hepatitis C.  Treatment as outpatient Discussed the management with the patient .

## 2021-08-28 NOTE — Plan of Care (Signed)
  Problem: Clinical Measurements: Goal: Ability to avoid or minimize complications of infection will improve Outcome: Progressing   Problem: Skin Integrity: Goal: Skin integrity will improve Outcome: Progressing   Problem: Education: Goal: Knowledge of General Education information will improve Description: Including pain rating scale, medication(s)/side effects and non-pharmacologic comfort measures Outcome: Progressing   Problem: Health Behavior/Discharge Planning: Goal: Ability to manage health-related needs will improve Outcome: Progressing   Problem: Clinical Measurements: Goal: Ability to maintain clinical measurements within normal limits will improve Outcome: Progressing Goal: Will remain free from infection Outcome: Progressing Goal: Diagnostic test results will improve Outcome: Progressing Goal: Respiratory complications will improve Outcome: Progressing Goal: Cardiovascular complication will be avoided Outcome: Progressing   Problem: Activity: Goal: Risk for activity intolerance will decrease Outcome: Progressing   Problem: Nutrition: Goal: Adequate nutrition will be maintained Outcome: Progressing   Problem: Coping: Goal: Level of anxiety will decrease Outcome: Progressing   Problem: Elimination: Goal: Will not experience complications related to bowel motility Outcome: Progressing Goal: Will not experience complications related to urinary retention Outcome: Progressing   Problem: Pain Managment: Goal: General experience of comfort will improve Outcome: Progressing   Problem: Skin Integrity: Goal: Risk for impaired skin integrity will decrease Outcome: Progressing

## 2021-08-29 ENCOUNTER — Encounter: Payer: Self-pay | Admitting: Pharmacy Technician

## 2021-08-29 ENCOUNTER — Other Ambulatory Visit: Payer: Self-pay

## 2021-08-29 ENCOUNTER — Inpatient Hospital Stay: Payer: Self-pay

## 2021-08-29 LAB — C-REACTIVE PROTEIN: CRP: 1 mg/dL — ABNORMAL HIGH (ref <1.0)

## 2021-08-29 LAB — SEDIMENTATION RATE: Sed Rate: 3 mm/hr (ref 0–15)

## 2021-08-29 MED ORDER — THIAMINE HCL 100 MG PO TABS: 100.0000 mg | ORAL_TABLET | Freq: Every day | ORAL | 0 refills | Status: AC

## 2021-08-29 MED ORDER — FOLIC ACID 1 MG PO TABS: 1.0000 mg | ORAL_TABLET | Freq: Every day | ORAL | 0 refills | Status: AC

## 2021-08-29 NOTE — Patient Outreach (Signed)
Patient only signed DOH Attestation.  Would need to provide current year's household income if PAP medications were needed. ? ?Johnda Billiot J. Julio Storr ?Patient Advocate Specialist ?[facility]  ?

## 2021-08-29 NOTE — TOC Initial Note (Signed)
Transition of Care [facility]) - Initial/Assessment Note    Patient Details  Name: Douglas Collins MRN: [id number] Date of Birth: [birth year]  Transition of Care [facility]) CM/SW Contact:    Marlowe Sax, RN Phone Number: 08/29/2021, 10:57 AM  Clinical Narrative:         The patient completed the attestation form to get medication from Outpatient Pharmacy,           Expected Discharge Plan: Home/Self Care Barriers to Discharge: Continued Medical Work up   Patient Goals and CMS Choice        Expected Discharge Plan and Services Expected Discharge Plan: Home/Self Care         Expected Discharge Date: 08/29/21                                    Prior Living Arrangements/Services                       Activities of Daily Living Home Assistive Devices/Equipment: None ADL Screening (condition at time of admission) Patient's cognitive ability adequate to safely complete daily activities?: Yes Is the patient deaf or have difficulty hearing?: No Does the patient have difficulty seeing, even when wearing glasses/contacts?: No Does the patient have difficulty concentrating, remembering, or making decisions?: No Patient able to express need for assistance with ADLs?: No Does the patient have difficulty dressing or bathing?: No Independently performs ADLs?: Yes (appropriate for developmental age) Does the patient have difficulty walking or climbing stairs?: No Weakness of Legs: None Weakness of Arms/Hands: None  Permission Sought/Granted                  Emotional Assessment              Admission diagnosis:  Abscess of dorsum of hand, right [L02.511] Abscess of dorsum of right hand [L02.511] Patient Active Problem List   Diagnosis Date Noted   Hypokalemia 08/20/2021   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) 08/16/2021   Abscess of dorsum of hand, right 08/11/2021   Alcohol use disorder 08/11/2021   Tobacco use disorder 08/11/2021    History of intravenous drug abuse (HCC) 08/11/2021   Tobacco abuse 07/20/2021   Alcohol abuse 07/20/2021   Hepatitis C 07/20/2021   Cellulitis of both feet 07/19/2021   PCP:  Pcp, No Pharmacy:   [id number] [place], [place] - [address] AT [facility] OF [address] [place] [address] [place] [address] Phone: [phone] Fax: [phone]  [facility] [address] [place] [address] Phone: [phone] Fax: [phone]     Social Determinants of Health (SDOH) Interventions    Readmission Risk Interventions     No data to display

## 2021-08-29 NOTE — Discharge Summary (Signed)
Douglas Collins GTX:[id number] DOB: 06/08/1985 DOA: 08/10/2021  PCP: Pcp, No  Admit date: 08/10/2021 Discharge date: 08/29/2021  Admitted From: Home Disposition: Home  Recommendations for Outpatient Follow-up:  Follow up with PCP in 1 week Please obtain BMP/CBC in one week Please follow up with ID in 2 weeks    Discharge Condition:Stable CODE STATUS: Full Diet recommendation: Regular     Brief/Interim Summary: Per YQM:[id number] Douglas Collins is a 36 y.o. male with medical history significant for Hepatitis C, tobacco use disorder, alcohol use disorder drinking 4 cans of beer daily, and occasional IV drug use, recently admitted on 07/19/2021 for bilateral lower extremity cellulitis, signing out AMA the following day, but with negative blood cultures at that time, who presents to the ED with swelling to the dorsal aspect of the right hand for the past 2 days. IR guided aspiration of fluid collection was performed on 5/26, came back with MSSA. Patient had right dorsal metacarpal incision and drainage and irrigation performed on 5/30, culture had no growth. MRI showed osteomyelitis.  Patient has been seen by ID, patient will need antibiotics for at least 2 weeks, potentially 4 to 6 weeks.  Currently on cefazolin.  Patient had history of IV drug abuse, could not be discharged to home with IV line.  Patient stayed in the hospital until his IV antibiotics was completed and discharged home with p.o. to complete course.    Right hand abscess secondary to MSSA. Right hand osteomyelitis secondary to MSSA Continued cefazolin until 6/13 then  transition to oral  Due to IV drug use could not place PICC line.  Needed treatment with IV as inpatient  Completed antibiotic dose with last dose on 6/13.   switch p.o. keflex 1 gram Q 6 for 4 more weeks  ESR/CRP Nml           Hepatitis C. Follow-up as outpatient       Hypokalemia. Replaced and stable  Alcohol use disorder,  tobacco  abuse. History of IV drug abuse. 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)   Hypokalemia    Discharge Instructions  Discharge Instructions     Call MD for:  temperature >100.4   Complete by: As directed    Diet - low sodium heart healthy   Complete by: As directed    Discharge instructions   Complete by: As directed    Start antibiotics tomorrow   Discharge wound care:   Complete by: As directed    As above F/u with ID in one week F/u with a pcp   Increase activity slowly   Complete by: As directed       Allergies as of 08/29/2021   No Known Allergies      Medication List     TAKE these medications    cephALEXin 500 MG capsule Commonly known as: KEFLEX Take 2 capsules (1,000 mg total) by mouth 4 (four) times daily for 28 days.   folic acid 1 MG tablet Commonly known as: FOLVITE Take 1 tablet (1 mg total) by mouth daily. Start taking on: August 30, 2021   thiamine 100 MG tablet Take 1 tablet (100 mg total) by mouth daily. Start  taking on: August 30, 2021               Discharge Care Instructions  (From admission, onward)           Start     Ordered   08/29/21 0000  Discharge wound care:       Comments: As above F/u with ID in one week F/u with a pcp   08/29/21 1033            Follow-up Information     Tsosie Billing, MD Follow up in 1 week(s).   Specialty: Infectious Diseases Contact information: [place] [address] [phone]                No Known Allergies  Consultations: ID   Procedures/Studies: DG Hand 2 View Right  Result Date: 08/29/2021 CLINICAL DATA:  Osteomyelitis EXAM: RIGHT HAND - 2  VIEW COMPARISON:  08/10/2021 FINDINGS: No acute fracture or dislocation of the right hand. Redemonstrated nonacute, partially callused fracture deformity of the proximal right fifth metacarpal, which appears to have bony fusion to the adjacent base of the fourth metacarpal. Joint spaces are generally preserved. Soft tissue edema about the dorsum of the hand. IMPRESSION: 1. No acute fracture or dislocation of the right hand. Redemonstrated nonacute, partially callused fracture deformity of the proximal right fifth metacarpal, which appears to have bony fusion to the adjacent base of the fourth metacarpal. 2.  No specific radiographic evidence of osteomyelitis. 3.  Soft tissue edema about the dorsum of the hand. Electronically Signed   By: Delanna Ahmadi M.D.   On: 08/29/2021 09:08   DG MINI C-ARM IMAGE ONLY  Result Date: 08/15/2021 There is no interpretation for this exam.  This order is for images obtained during a surgical procedure.  Please See "Surgeries" Tab for more information regarding the procedure.   MR HAND RIGHT W WO CONTRAST  Result Date: 08/14/2021 CLINICAL DATA:  Right hand abscess.  Recent aspiration EXAM: MRI OF THE RIGHT HAND WITHOUT AND WITH CONTRAST TECHNIQUE: Multiplanar, multisequence MR imaging of the right hand was performed before and after the administration of intravenous contrast. CONTRAST:  55m GADAVIST GADOBUTROL 1 MMOL/ML IV SOLN COMPARISON:  MRI 08/11/2021 FINDINGS: Bones/Joint/Cartilage Progressive patchy bone marrow edema and enhancement throughout the carpal bones and bases of the second through fifth metacarpals. Patchy areas of intermediate T1 marrow signal throughout this region. Moderate sized midcarpal joint effusion with enhancing synovitis. Small radiocarpal joint effusion. There also small effusions of the first, third, fourth, and fifth CMC joints. Healing fracture of the fifth metacarpal diaphysis, similar to prior. Ligaments No acute ligamentous injury identified.  Muscles and Tendons Trace second and fourth extensor compartment tenosynovial fluid, unchanged. Mild intramuscular edema of the hand and wrist. No intramuscular fluid collection. Soft tissues Similar size and appearance of fluid collection at the dorsal aspect of the wrist measuring approximately 3.8 x 0.7 x 0.6 cm with peripheral enhancement. This is favored to represent a midcarpal joint effusion. Dorsal subcutaneous edema. No definite extra-articular fluid collections. IMPRESSION: 1. Progressive patchy bone marrow edema and enhancement throughout the carpal bones and bases of the second through fifth metacarpals. Moderate sized midcarpal joint effusion with enhancing synovitis. Findings are concerning for septic arthritis and osteomyelitis. 2. Similar size and appearance of fluid collection at the dorsal aspect of the wrist measuring approximately 3.8 x 0.7 x 0.6 cm with peripheral enhancement. This is favored to represent a midcarpal joint effusion/septic arthritis rather than extra-articular  abscess. 3. Trace second and fourth extensor compartment tenosynovial fluid, also concerning for septic arthritis. Electronically Signed   By: Davina Poke D.O.   On: 08/14/2021 20:09   [place] DRAIN/INJ INTER JOINT/BURSA  Result Date: 08/11/2021 INDICATION: Concern for abscess of the right hand EXAM: Ultrasound-guided aspiration of right hand fluid collection MEDICATIONS: None. ANESTHESIA/SEDATION: Local analgesia COMPLICATIONS: None immediate. PROCEDURE: Informed written consent was obtained from the patient after a thorough discussion of the procedural risks, benefits and alternatives. All questions were addressed. Maximal Sterile Barrier Technique was utilized including caps, mask, sterile gowns, sterile gloves, sterile drape, hand hygiene and skin antiseptic. A timeout was performed prior to the initiation of the procedure. The patient's hand was placed on the exam table. Focused sonographic exam of the dorsum of  the right hand demonstrated a small anechoic fluid collection just deep to the subcutaneous tissues measuring 1.6 x 1.3 x 0.8 cm. Skin entry site was marked, and the overlying skin was prepped and draped in the standard sterile fashion. Local analgesia was obtained with 1% lidocaine. Using ultrasound guidance, aspiration of the fluid collection was performed using a 21 gauge needle. Approximately 1 mL of thick serous fluid was aspirated. The sample was sent to the lab for analysis. A clean dressing was placed after hemostasis. The patient tolerated the procedure without immediate complication. IMPRESSION: Successful ultrasound-guided aspiration of fluid collection in the dorsum of the right hand yielding approximately 1 mL of thick serous fluid. Sample sent for microbiology analysis. Electronically Signed   By: Albin Felling M.D.   On: 08/11/2021 16:26   MR HAND RIGHT W WO CONTRAST  Result Date: 08/11/2021 CLINICAL DATA:  Soft tissue mass of right hand. Right dorsal hand pain and swelling over the past couple of days. No trauma. Remote right hand fracture months to a year ago. EXAM: MRI OF THE RIGHT HAND WITHOUT AND WITH CONTRAST TECHNIQUE: Multiplanar, multisequence MR imaging of the right hand was performed before and after the administration of intravenous contrast. CONTRAST:  15m GADAVIST GADOBUTROL 1 MMOL/ML IV SOLN COMPARISON:  Right hand radiographs 08/10/2021 FINDINGS: Bones/Joint/Cartilage There is cortical thickening from healing of a fracture of the proximal diaphysis of fifth metacarpal seen on prior radiographs. There is mild associated marrow edema within the metacarpal shaft suggesting incomplete healing at this time. There is associated foreshortening of the fifth metacarpal shaft. There are moderate degenerative osteophytes at the dorsal aspect of the fifth carpometacarpal joint, likely posttraumatic in etiology. Ligaments The metacarpophalangeal and interphalangeal medial and lateral  collateral ligaments appear intact. Muscles and Tendons Mild extensor carpi radialis longus and brevis and mild fourth dorsal extensor compartment tenosynovitis. Soft tissues Deep to the extensor carpi radialis brevis tendon in the fourth dorsal extensor tendon compartment at the level of the midcarpal joint, there is decreased T1 increased T2 signal fluid with mild peripheral border enhancement (axial images 4-7) and irregular borders. This corresponds to the fluid seen on ultrasound. This may represent a moderate midcarpal joint effusion with peripheral enhancement suspicious for synovitis and possible joint infection. This also could represent an abscess within the soft tissues just dorsal to the midcarpal joint. Abscess just dorsal to the midcarpal row. This measures up to approximately 3.6 by 0.9 by 1.1 cm (transverse by short axis of the hand by long axis of the hand). There is additional teardrop shaped decreased T1 and increased T2 signal fluid without definite peripheral enhancement extending dorsally from the fourth dorsal extensor compartment tendon sheath at the dorsal medial  aspect of the fourth metacarpophalangeal joint (axial series 6, image 21), a possible ganglion. There is moderate edema and swelling within the subcutaneous fat of the dorsal wrist and hand of the level of the metacarpophalangeal joints, greatest at the level of the distal fourth and second metacarpal shafts. IMPRESSION: 1. Moderate edema and swelling within the subcutaneous fat of the dorsal hand suggesting cellulitis. 2. Walled-off fluid just dorsal to the midcarpal joint with thin peripheral wall enhancement. This may represent a moderate joint effusion with synovial enhancement, concerning for infection within the joint fluid (septic joint). A walled-off abscess just dorsal to the midcarpal row and deep to the extensor tendons is also possible. 3. Small teardrop-shaped fluid extending dorsally from the fourth digit extensor  tendons near the metacarpophalangeal joint, a possible nonspecific ganglion. 4. Mild extensor carpi radialis longus and brevis and mild fourth dorsal extensor compartment tenosynovitis. Cannot exclude the presence of infection within this fluid. 5. Partially healed fracture of the proximal shaft of fifth metacarpal, as seen on prior radiographs. There is edema within the fifth metacarpal shaft suggesting incomplete healing at this time. Electronically Signed   By: Yvonne Kendall M.D.   On: 08/11/2021 13:15   [place] RT UPPER EXTREM LTD SOFT TISSUE NON VASCULAR  Result Date: 08/11/2021 CLINICAL DATA:  Swelling along the dorsum of the hand. Concern for abscess. In EXAM: ULTRASOUND RIGHT UPPER EXTREMITY LIMITED TECHNIQUE: Ultrasound examination of the upper extremity soft tissues was performed in the area of clinical concern. COMPARISON:  None Available. FINDINGS: Ultrasound performed along the dorsum of the hand. This demonstrates a small fluid collection deep within the dorsum of the hand measuring 1.3 x 3.3 x 1.0 cm. This is concerning for deep tissue abscess. IMPRESSION: 3.3 x 1.3 x 1.0 cm fluid collection concerning for deep tissue abscess along the dorsum of the hand. Electronically Signed   By: Rolm Baptise M.D.   On: 08/11/2021 01:36   DG Hand Complete Right  Result Date: 08/10/2021 CLINICAL DATA:  Right hand pain, swelling EXAM: RIGHT HAND - COMPLETE 3+ VIEW COMPARISON:  None Available. FINDINGS: Mild widening of the distal radioulnar joint, possibly posttraumatic in nature. No acute fracture or dislocation. Healing proximal diaphyseal fracture of the right fifth metacarpal identified with mild shortening. Mild volar angulation of the distal fracture fragment. 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 Extensive soft tissue swelling of the right hand. No retained radiopaque foreign body. IMPRESSION: Soft tissue swelling.  No acute fracture or dislocation. Healing right fifth  metacarpal fracture demonstrating residual volar angulation. 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 Electronically Signed   By: Fidela Salisbury M.D.   On: 08/10/2021 23:39      Subjective: No complaints.  Discharge Exam: Vitals:   08/29/21 0423 08/29/21 0756  BP: 125/72 125/66  Pulse: 94 88  Resp: 17   Temp:  97.8 F (36.6 C)  SpO2: 97% 100%   Vitals:   08/28/21 0810 08/28/21 2003 08/29/21 0423 08/29/21 0756  BP: 106/62 136/86 125/72 125/66  Pulse: (!) 54 80 94 88  Resp: '16 17 17   ' Temp: 97.6 F (36.4 C) 99.2 F (37.3 C)  97.8 F (36.6 C)  TempSrc:      SpO2: 98% 99% 97% 100%  Weight:      Height:        General: Pt is alert, awake, not in acute distress Cardiovascular: RRR, S1/S2 +, no rubs, no gallops Respiratory: CTA  bilaterally, no wheezing, no rhonchi Abdominal: Soft, NT, ND, bowel sounds + Extremities: no edema, no cyanosis    The results of significant diagnostics from this hospitalization (including imaging, microbiology, ancillary and laboratory) are listed below for reference.     Microbiology: No results found for this or any previous visit (from the past 240 hour(s)).   Labs: BNP (last 3 results) Recent Labs    07/19/21 2112  BNP 14.7   Basic Metabolic Panel: Recent Labs  Lab 08/23/21 0508 08/28/21 1004  NA 139 140  K 4.1 3.8  CL 103 106  CO2 30 28  GLUCOSE 104* 91  BUN 12 9  CREATININE 0.52* 0.70  CALCIUM 9.2 9.0  MG 2.2  --    Liver Function Tests: No results for input(s): "AST", "ALT", "ALKPHOS", "BILITOT", "PROT", "ALBUMIN" in the last 168 hours. No results for input(s): "LIPASE", "AMYLASE" in the last 168 hours. No results for input(s): "AMMONIA" in the last 168 hours. CBC: Recent Labs  Lab 08/23/21 0508 08/28/21 1004  WBC 6.3 4.8  HGB 14.7 16.2  HCT 44.2 49.4  MCV 90.6 91.5  PLT 370 349   Cardiac Enzymes: No results for input(s): "CKTOTAL", "CKMB",  "CKMBINDEX", "TROPONINI" in the last 168 hours. BNP: Invalid input(s): "POCBNP" CBG: No results for input(s): "GLUCAP" in the last 168 hours. D-Dimer No results for input(s): "DDIMER" in the last 72 hours. Hgb A1c No results for input(s): "HGBA1C" in the last 72 hours. Lipid Profile No results for input(s): "CHOL", "HDL", "LDLCALC", "TRIG", "CHOLHDL", "LDLDIRECT" in the last 72 hours. Thyroid function studies No results for input(s): "TSH", "T4TOTAL", "T3FREE", "THYROIDAB" in the last 72 hours.  Invalid input(s): "FREET3" Anemia work up No results for input(s): "VITAMINB12", "FOLATE", "FERRITIN", "TIBC", "IRON", "RETICCTPCT" in the last 72 hours. Urinalysis    Component Value Date/Time   COLORURINE COLORLESS (A) 08/11/2021 0011   APPEARANCEUR CLEAR (A) 08/11/2021 0011   LABSPEC 1.002 (L) 08/11/2021 0011   PHURINE 6.0 08/11/2021 0011   GLUCOSEU NEGATIVE 08/11/2021 0011   HGBUR NEGATIVE 08/11/2021 0011   BILIRUBINUR NEGATIVE 08/11/2021 0011   KETONESUR NEGATIVE 08/11/2021 0011   PROTEINUR NEGATIVE 08/11/2021 0011   NITRITE NEGATIVE 08/11/2021 0011   LEUKOCYTESUR NEGATIVE 08/11/2021 0011   Sepsis Labs Recent Labs  Lab 08/23/21 0508 08/28/21 1004  WBC 6.3 4.8   Microbiology No results found for this or any previous visit (from the past 240 hour(s)).   Time coordinating discharge: Over 30 minutes  SIGNED:   Nolberto Hanlon, MD  [facility] 08/29/2021, 10:36 AM Pager   If 7PM-7AM, please contact night-coverage [email or web address] Password [email or web address]

## 2021-08-29 NOTE — Progress Notes (Signed)
Patient d/ced to home with significant other via wheelchair to medical mall entrance.  IV was taken out and AVS packet explained to him.

## 2021-08-29 NOTE — Plan of Care (Signed)

## 2021-08-29 NOTE — Progress Notes (Signed)
Pharmacy - Antimicrobial Stewardship  Cephalexin delivered from medication mgmt to bedside.  Patient sleeping and did not awaken.  Visitor in room (significant other per other healthcare team members).  Instructions on how/when to take and side effects to monitor given to visitor.  Doreene Eland, PharmD, BCPS, BCIDP Work Cell: [phone] 08/29/2021 3:00 PM

## 2021-09-07 ENCOUNTER — Inpatient Hospital Stay: Payer: Medicaid Other | Admitting: Infectious Diseases

## 2021-09-21 ENCOUNTER — Inpatient Hospital Stay: Payer: Medicaid Other | Admitting: Infectious Diseases

## 2024-02-23 IMAGING — MR MR [PERSON_NAME]*[PERSON_NAME]* WO/W CM
9 series · 40 of 40 positions shown · IV contrast (gadavist)
Comparison: MRI 08/11/2021

CLINICAL DATA: Right hand abscess.  Recent aspiration

EXAM:
MRI OF THE RIGHT HAND WITHOUT AND WITH CONTRAST
TECHNIQUE: Multiplanar, multisequence MR imaging of the right hand was
performed before and after the administration of intravenous
contrast.
CONTRAST:  7mL GADAVIST GADOBUTROL 1 MMOL/ML IV SOLN

[Series 6: STIR · coronal · right · 3.0mm · 0.56mm/px · 2 of 21 slices shown]
[im 1/21]
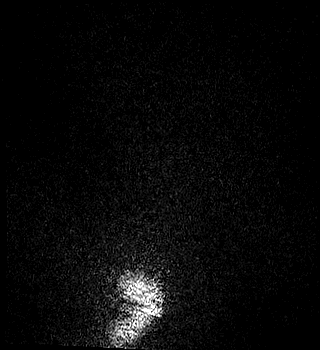
[im 21/21]
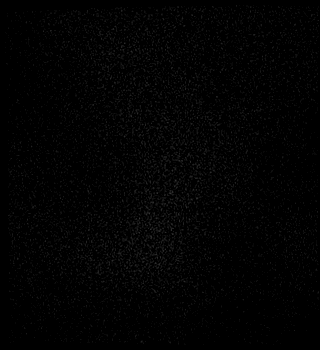

[Series 7: T1 · coronal · right · 3.0mm · 0.56mm/px · 2 of 21 slices shown (1 of 2)]
[im 1/21]
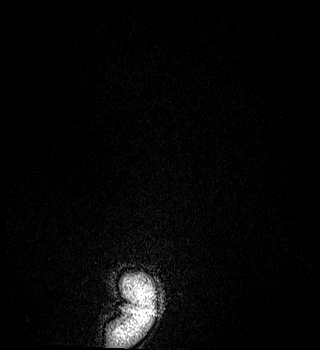
[im 21/21]
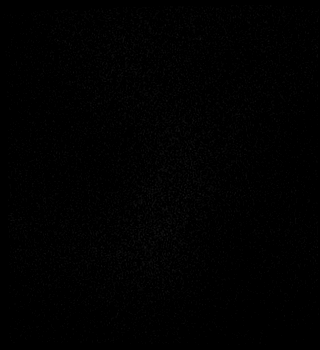

[Series 8: PD fat-sat · sagittal · right · 3.0mm · 0.46mm/px · 4 of 38 slices shown]
[im 1/38]
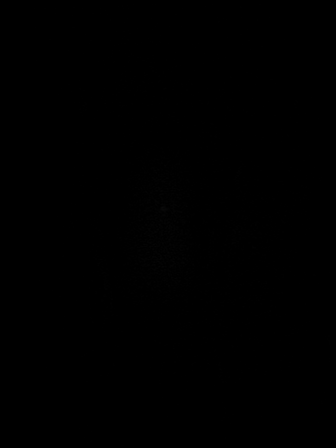
[im 13/38]
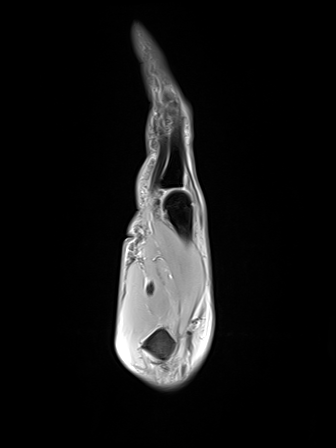
[im 25/38]
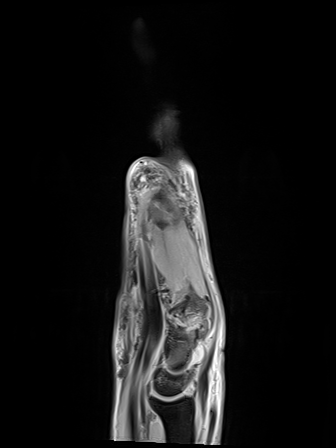
[im 38/38]
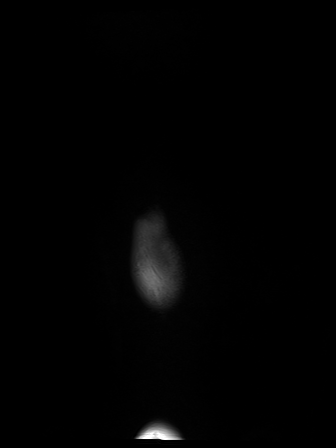

[Series 10: T1 fat-sat post-contrast · coronal · right · 3.0mm · 0.56mm/px · 2 of 21 slices shown]
[im 1/21]
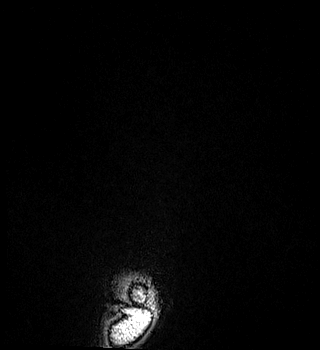
[im 21/21]
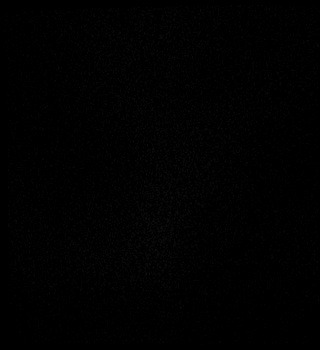

[Series 1015: T1 · axial · right · 4.0mm · 0.31mm/px · z∈[-81,+106]mm · 6 of 45 slices shown (2 of 2)]
[im 1/45]
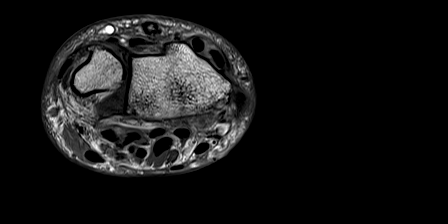
[im 9/45]
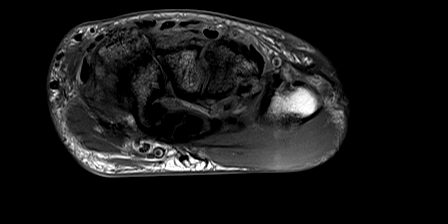
[im 18/45]
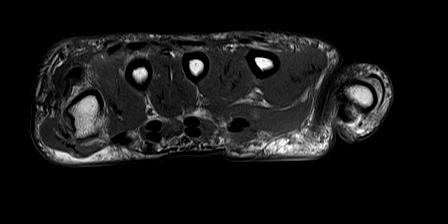
[im 27/45]
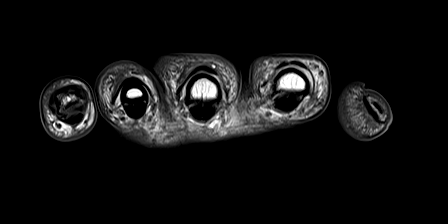
[im 36/45]
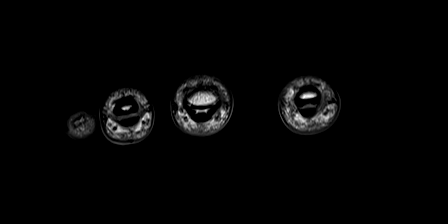
[im 45/45]
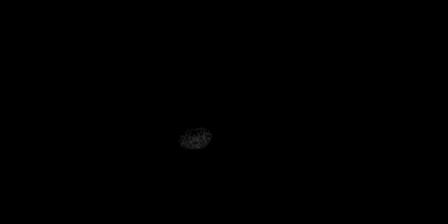

[Series 1022: T2 · axial · right · 4.0mm · 0.31mm/px · z∈[-81,+106]mm · 6 of 45 slices shown (1 of 2)]
[im 1/45]
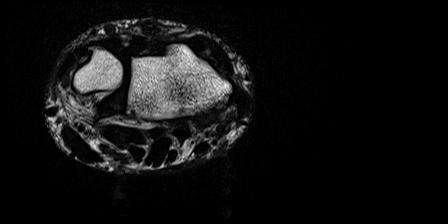
[im 9/45]
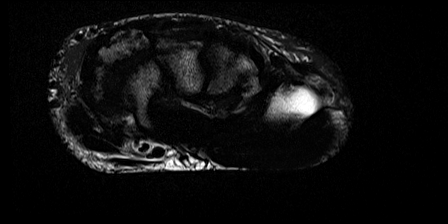
[im 18/45]
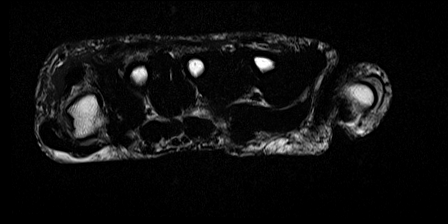
[im 27/45]
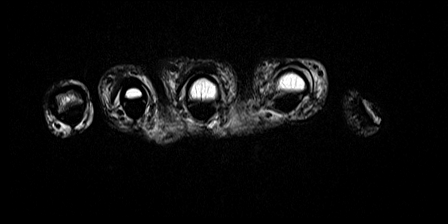
[im 36/45]
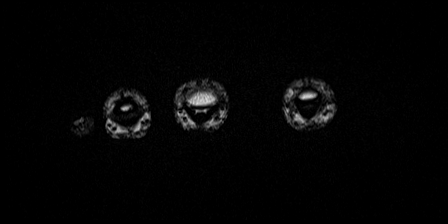
[im 45/45]
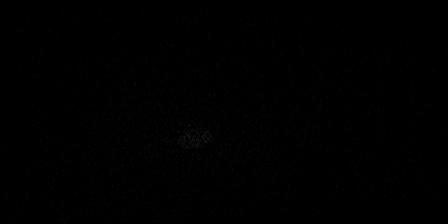

[Series 1022: T2 · axial · right · 4.0mm · 0.31mm/px · z∈[-81,+106]mm · 6 of 45 slices shown (2 of 2)]
[im 1/45]
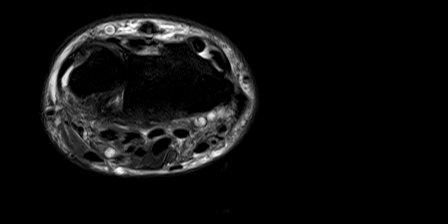
[im 9/45]
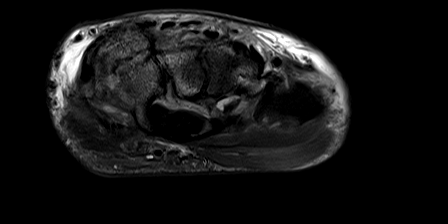
[im 18/45]
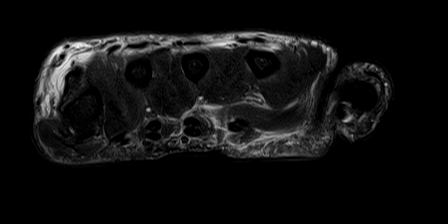
[im 27/45]
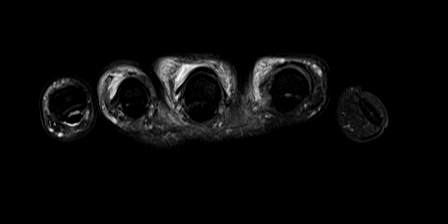
[im 36/45]
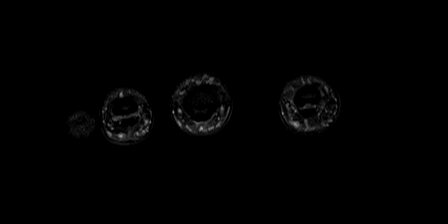
[im 45/45]
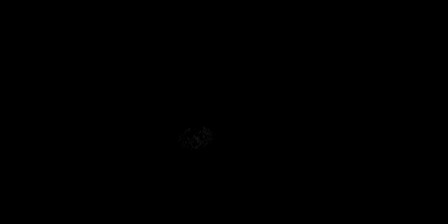

[Series 1029: ax t1_ · axial · right · 4.0mm · 0.31mm/px · z∈[-81,+106]mm · 6 of 45 slices shown]
[im 1/45]
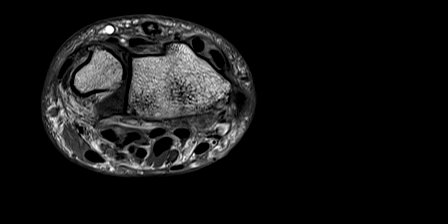
[im 9/45]
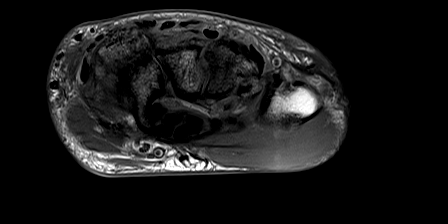
[im 18/45]
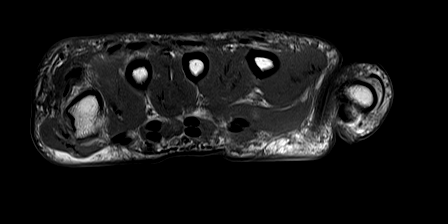
[im 27/45]
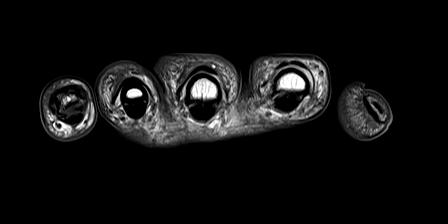
[im 36/45]
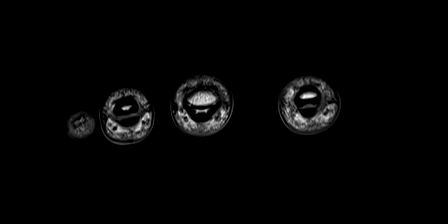
[im 45/45]
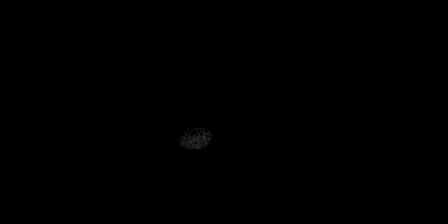

[Series 1036: T1 fat-sat · axial · right · 4.0mm · 0.31mm/px · z∈[-81,+106]mm · 6 of 45 slices shown]
[im 1/45]
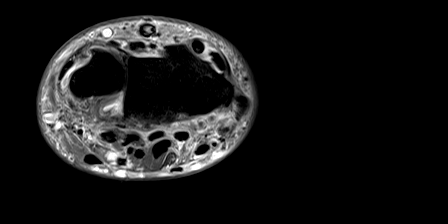
[im 9/45]
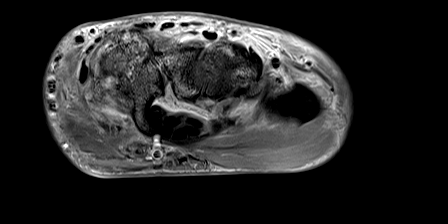
[im 18/45]
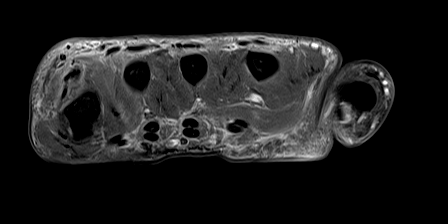
[im 27/45]
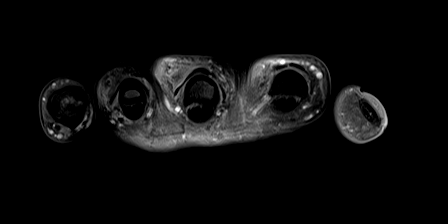
[im 36/45]
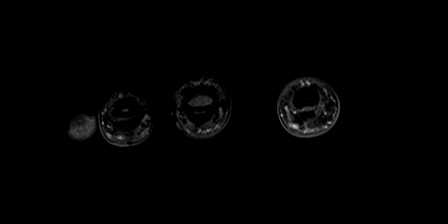
[im 45/45]
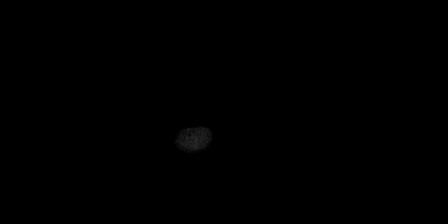

[40 of 40 positions shown; findings below may reference images not displayed]

FINDINGS: Bones/Joint/Cartilage

Progressive patchy bone marrow edema and enhancement throughout the
carpal bones and bases of the second through fifth metacarpals.
Patchy areas of intermediate T1 marrow signal throughout this
region. Moderate sized midcarpal joint effusion with enhancing
synovitis. Small radiocarpal joint effusion. There also small
effusions of the first, third, fourth, and fifth CMC joints.

Healing fracture of the fifth metacarpal diaphysis, similar to
prior.

Ligaments

No acute ligamentous injury identified.

Muscles and Tendons

Trace second and fourth extensor compartment tenosynovial fluid,
unchanged. Mild intramuscular edema of the hand and wrist. No
intramuscular fluid collection.

Soft tissues

Similar size and appearance of fluid collection at the dorsal aspect
of the wrist measuring approximately 3.8 x 0.7 x 0.6 cm with
peripheral enhancement. This is favored to represent a midcarpal
joint effusion. Dorsal subcutaneous edema. No definite
extra-articular fluid collections.
IMPRESSION: 1. Progressive patchy bone marrow edema and enhancement throughout
the carpal bones and bases of the second through fifth metacarpals.
Moderate sized midcarpal joint effusion with enhancing synovitis.
Findings are concerning for septic arthritis and osteomyelitis.
2. Similar size and appearance of fluid collection at the dorsal
aspect of the wrist measuring approximately 3.8 x 0.7 x 0.6 cm with
peripheral enhancement. This is favored to represent a midcarpal
joint effusion/septic arthritis rather than extra-articular abscess.
3. Trace second and fourth extensor compartment tenosynovial fluid,
also concerning for septic arthritis.

## 2024-03-09 IMAGING — DX DG HAND 2V*R*
2 series · 2 of 2 positions shown · non-contrast
Comparison: 08/10/2021

CLINICAL DATA: Osteomyelitis

EXAM:
RIGHT HAND - 2 VIEW

[hand ap]
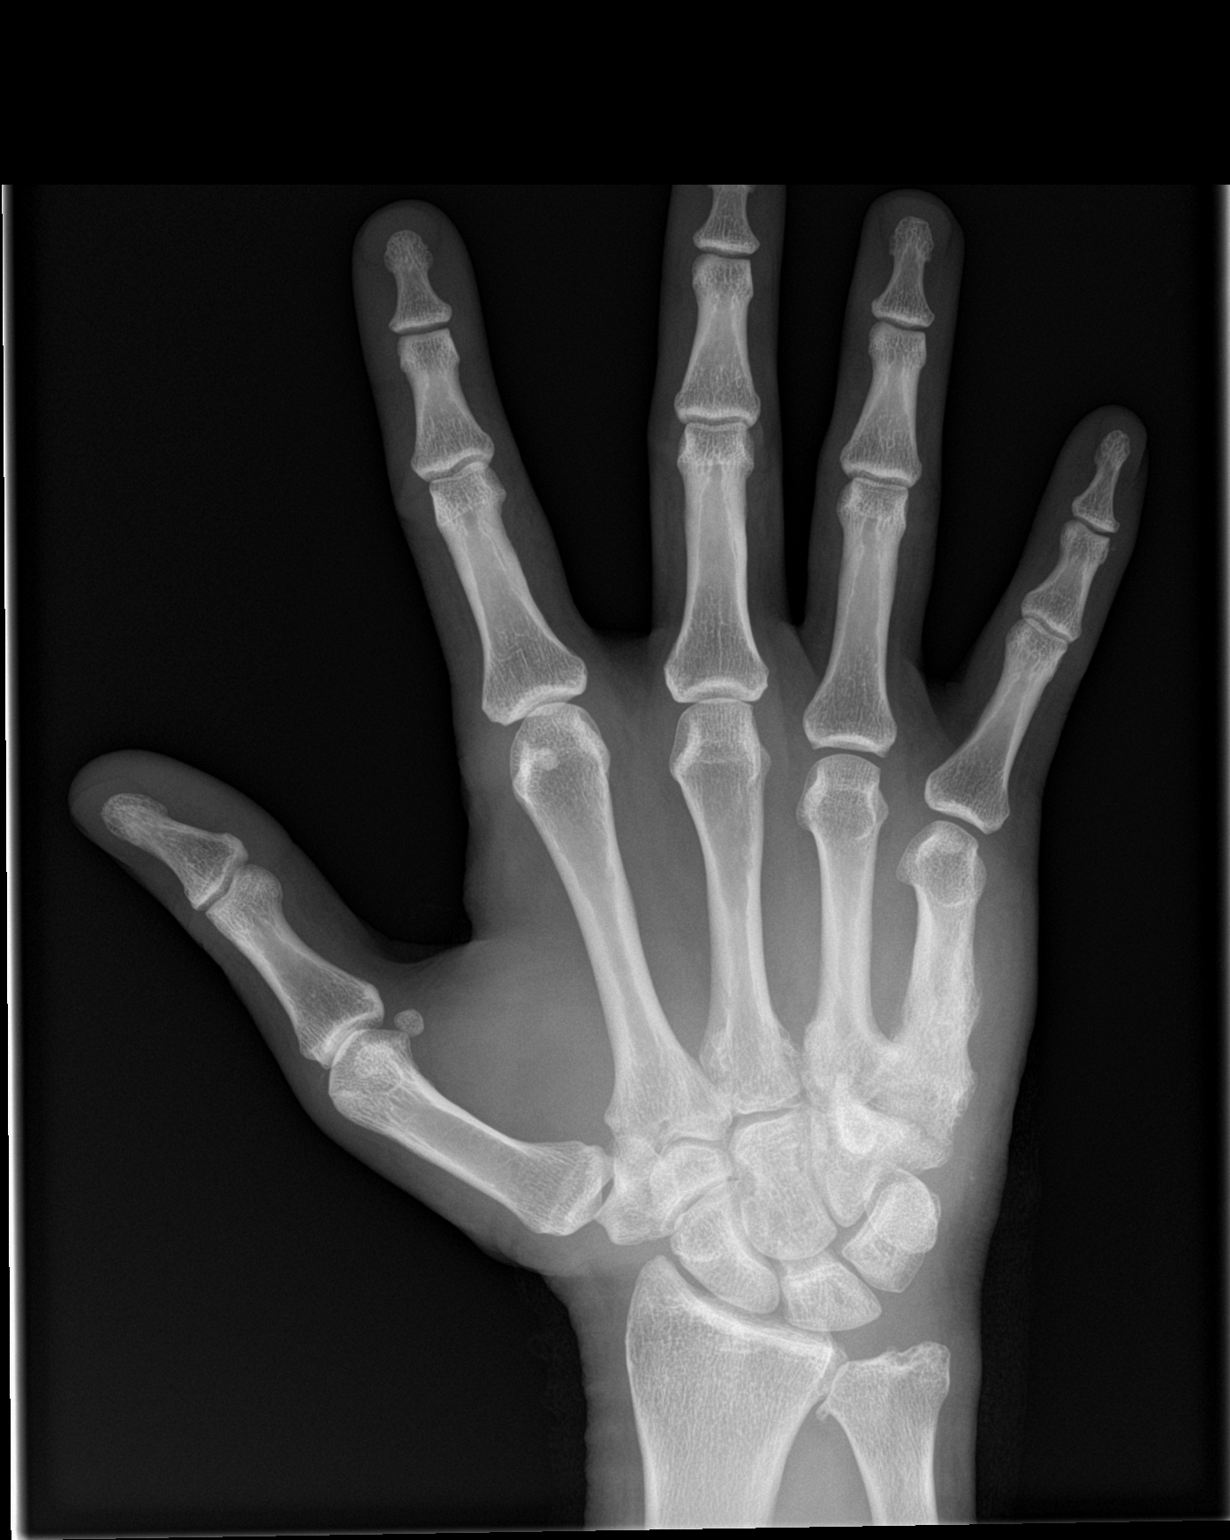

[hand lat]
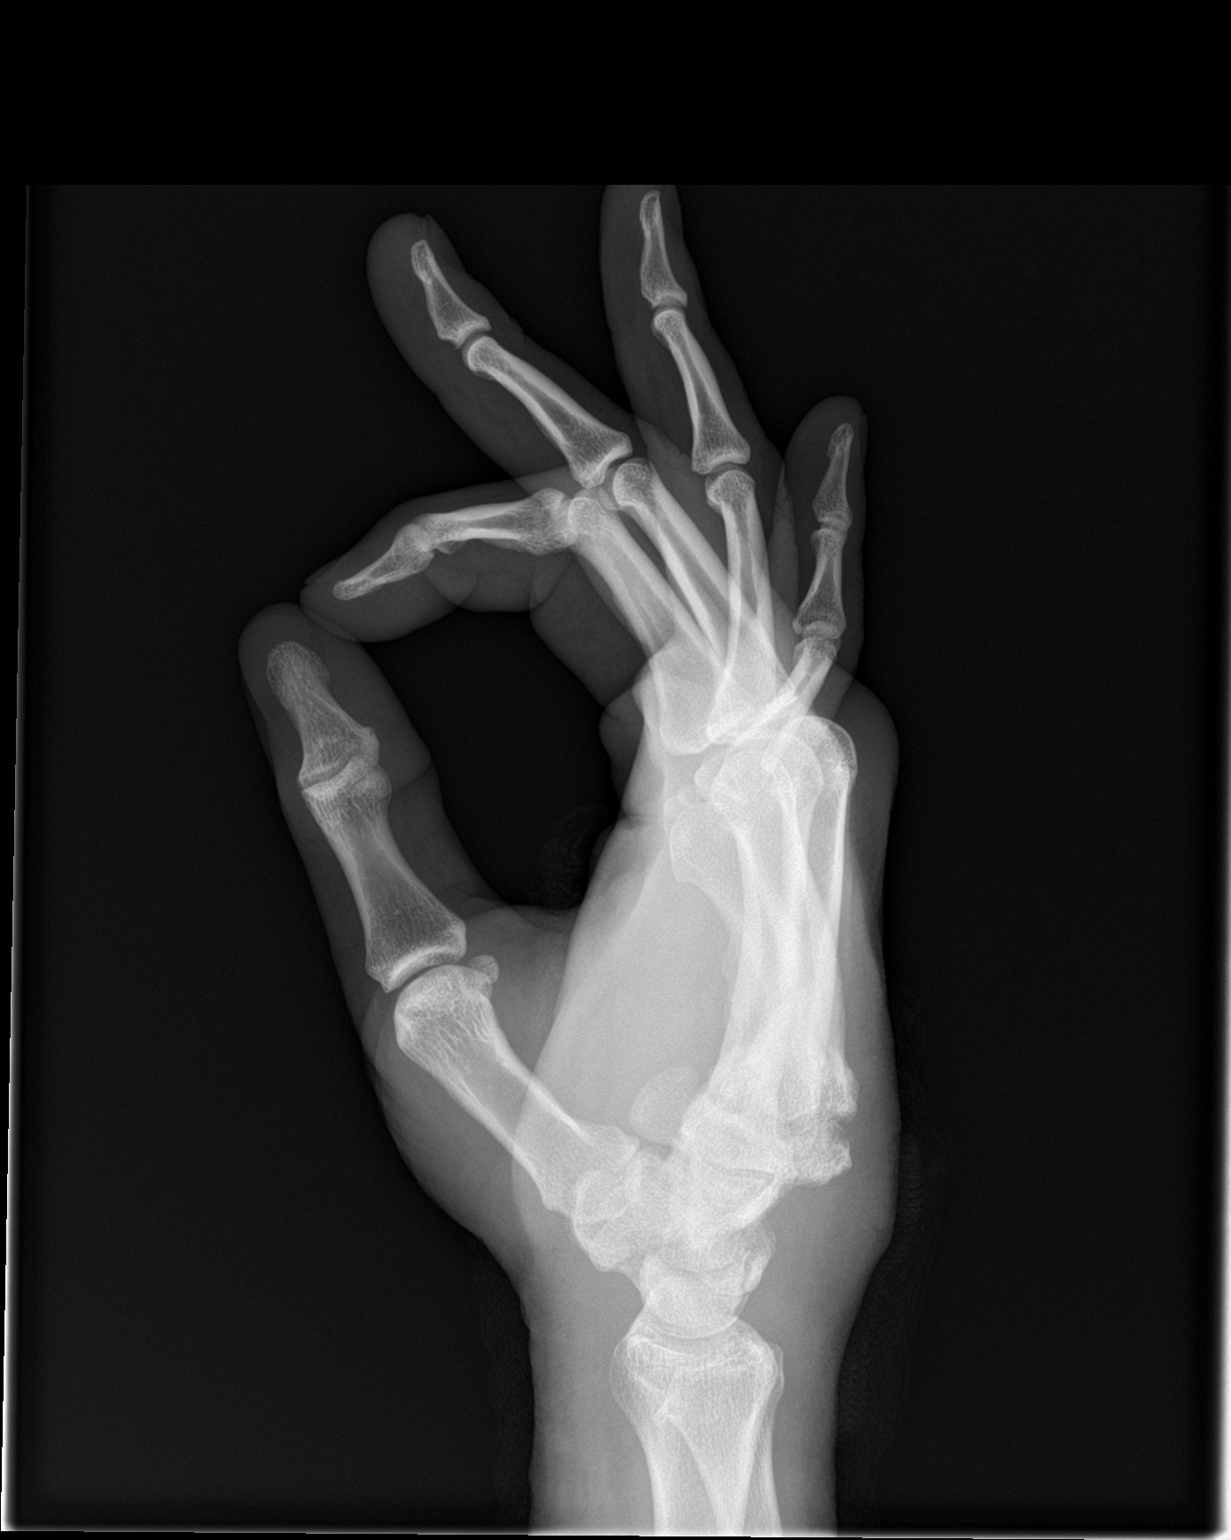

[2 of 2 positions shown; findings below may reference images not displayed]

FINDINGS: No acute fracture or dislocation of the right hand. Redemonstrated
nonacute, partially callused fracture deformity of the proximal
right fifth metacarpal, which appears to have bony fusion to the
adjacent base of the fourth metacarpal. Joint spaces are generally
preserved. Soft tissue edema about the dorsum of the hand.
IMPRESSION: 1. No acute fracture or dislocation of the right hand.
Redemonstrated nonacute, partially callused fracture deformity of
the proximal right fifth metacarpal, which appears to have bony
fusion to the adjacent base of the fourth metacarpal.

2.  No specific radiographic evidence of osteomyelitis.

3.  Soft tissue edema about the dorsum of the hand.
